# Patient Record
Sex: Female | Born: 1960 | Race: White | Hispanic: No | Smoking: Current every day smoker
Health system: Southern US, Community
[De-identification: ages and names within clinical notes are randomized; demographics above are authoritative.]

## PROBLEM LIST (undated history)

## (undated) DIAGNOSIS — R51 Headache: Secondary | ICD-10-CM

## (undated) DIAGNOSIS — G35 Multiple sclerosis: Secondary | ICD-10-CM

## (undated) DIAGNOSIS — G629 Polyneuropathy, unspecified: Secondary | ICD-10-CM

## (undated) DIAGNOSIS — R519 Headache, unspecified: Secondary | ICD-10-CM

## (undated) DIAGNOSIS — H409 Unspecified glaucoma: Secondary | ICD-10-CM

## (undated) DIAGNOSIS — Z9049 Acquired absence of other specified parts of digestive tract: Secondary | ICD-10-CM

## (undated) HISTORY — DX: Acquired absence of other specified parts of digestive tract: Z90.49

## (undated) HISTORY — DX: Headache, unspecified: R51.9

## (undated) HISTORY — DX: Headache: R51

## (undated) HISTORY — DX: Polyneuropathy, unspecified: G62.9

---
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
## 1999-01-27 ENCOUNTER — Ambulatory Visit (HOSPITAL_COMMUNITY): Admission: RE | Admit: 1999-01-27 | Discharge: 1999-01-27 | Payer: Self-pay | Admitting: Ophthalmology

## 1999-01-27 ENCOUNTER — Encounter: Payer: Self-pay | Admitting: Ophthalmology

## 1999-02-06 ENCOUNTER — Ambulatory Visit: Admission: RE | Admit: 1999-02-06 | Discharge: 1999-02-06 | Payer: Self-pay | Admitting: Anesthesiology

## 1999-11-24 ENCOUNTER — Other Ambulatory Visit: Admission: RE | Admit: 1999-11-24 | Discharge: 1999-11-24 | Payer: Self-pay | Admitting: Internal Medicine

## 2002-01-09 ENCOUNTER — Other Ambulatory Visit: Admission: RE | Admit: 2002-01-09 | Discharge: 2002-01-09 | Payer: Self-pay | Admitting: Internal Medicine

## 2004-04-21 ENCOUNTER — Ambulatory Visit: Payer: Self-pay | Admitting: Internal Medicine

## 2004-04-25 ENCOUNTER — Ambulatory Visit: Payer: Self-pay | Admitting: Internal Medicine

## 2004-05-05 ENCOUNTER — Ambulatory Visit: Payer: Self-pay | Admitting: Internal Medicine

## 2004-05-05 ENCOUNTER — Other Ambulatory Visit: Admission: RE | Admit: 2004-05-05 | Discharge: 2004-05-05 | Payer: Self-pay | Admitting: Internal Medicine

## 2004-07-14 ENCOUNTER — Ambulatory Visit: Payer: Self-pay | Admitting: Internal Medicine

## 2005-01-26 ENCOUNTER — Ambulatory Visit: Payer: Self-pay | Admitting: Internal Medicine

## 2007-11-17 ENCOUNTER — Encounter: Payer: Self-pay | Admitting: Internal Medicine

## 2007-12-05 ENCOUNTER — Encounter: Admission: RE | Admit: 2007-12-05 | Discharge: 2007-12-05 | Payer: Self-pay | Admitting: Gastroenterology

## 2010-07-09 ENCOUNTER — Encounter: Payer: Self-pay | Admitting: Gastroenterology

## 2010-07-10 ENCOUNTER — Encounter: Payer: Self-pay | Admitting: Gastroenterology

## 2013-12-25 ENCOUNTER — Encounter: Payer: Self-pay | Admitting: Neurology

## 2013-12-29 ENCOUNTER — Encounter (INDEPENDENT_AMBULATORY_CARE_PROVIDER_SITE_OTHER): Payer: Self-pay

## 2013-12-29 ENCOUNTER — Ambulatory Visit (INDEPENDENT_AMBULATORY_CARE_PROVIDER_SITE_OTHER): Payer: Medicare Other | Admitting: Neurology

## 2013-12-29 ENCOUNTER — Encounter: Payer: Self-pay | Admitting: Neurology

## 2013-12-29 VITALS — BP 126/77 | HR 70 | Ht 67.5 in | Wt 127.0 lb

## 2013-12-29 DIAGNOSIS — G35 Multiple sclerosis: Secondary | ICD-10-CM

## 2013-12-29 MED ORDER — GABAPENTIN 300 MG PO CAPS: ORAL_CAPSULE | ORAL | Status: DC

## 2013-12-29 MED ORDER — PREDNISONE 10 MG PO TABS: 10.0000 mg | ORAL_TABLET | Freq: Every day | ORAL | Status: DC

## 2013-12-29 NOTE — Patient Instructions (Signed)
Overall you are doing fairly well but I do want to suggest a few things today:   Remember to drink plenty of fluid, eat healthy meals and do not skip any meals. Try to eat protein with a every meal and eat a healthy snack such as fruit or nuts in between meals. Try to keep a regular sleep-wake schedule and try to exercise daily, particularly in the form of walking, 20-30 minutes a day, if you can.   As far as your medications are concerned, I would like to suggest the following: 1)Please decrease prednisone to 10mg  daily for 14 days and then decrease to 5mg  (1/2 tab) daily for 14 days and then stop 2)Please start gabapentin 300mg  three times a day 3)Please take Nexium daily, this can be obtained over the counter from your drug store 4)Please continue to take Avonex  As far as diagnostic testing:  1)Please have a MRI of the brain and cervical spine  I would like to see you back in 4 months, sooner if we need to. Please call us with any interim questions, concerns, problems, updates or refill requests.   My clinical assistant and will answer any of your questions and relay your messages to me and also relay most of my messages to you.   Our phone number is [phone]. We also have an after hours call service for urgent matters and there is a physician on-call for urgent questions. For any emergencies you know to call 911 or go to the nearest emergency room

## 2013-12-29 NOTE — Progress Notes (Signed)
[facility]    Provider:  Dr Hosie Poisson Referring Provider: Richmond Campbell., PA-C Primary Care Physician:  Lilia Argue  CC:  MS follow up   HPI:  Alyssa Gilbert is a 53 y.o. female here as a referral from Dr. Arlyce Dice for MS follow up  Has been on Avonex for MS since diagnosis in 2000. Followed by [facility] in the past, last visit in 2009 (followed by Dr Kelli Hope and his PA). Initial symptom was loss of vision in left eye, slowly returned with steroids. Has had minor flare ups but nothing major until the past year. In the past year has had multiple problems. Currently notes some tingling and pins and needles sensation in her left leg. Is now spreading up, involves her abdomen and rib cage. Notes severe abdominal discomfort and a bloating sensation. Currently taking Avonex but notes she missed her shot this week due to side effects.   In [facility] was going to a free clinic, anytime she felt something coming on the clinic would give her prednisone tablets and she would self medicate. Had stopped prednisone for a year but 3 weeks ago started taking prednisone again. She notes having had 4 MRIs in the past month.   Has history of headaces, notes getting headaches daily. Will take ibuprofen up to 3 times a day for the headache. Typically will take at least one time a day. Notes floaters in her eyes, these are chronic, recently started having auras described as flashing lights. Headaches described as frontal throbbing type pain. She notes having history of glaucoma. Last time she saw her eye doctor was in March.   Review of Systems: Out of a complete 14 system review, the patient complains of only the following symptoms, and all other reviewed systems are negative. *+ easy bruising, feeling hot, fatigue, headache, weakness, dizziness  History   Social History  . Marital Status: Divorced    Spouse Name: N/A    Number of Children: 0  . Years of Education: 12    Occupational History  . Disabled    Social History Main Topics  . Smoking status: Current Every Day Smoker  . Smokeless tobacco: Never Used  . Alcohol Use: No     Comment: quit 2010  . Drug Use: Yes    Special: Marijuana     Comment: Daily   . Sexual Activity: Not on file   Other Topics Concern  . Not on file   Social History Narrative   [name]    [name]    [name]    [name]    Patient has 1 child.    [name]     Family History  Problem Relation Age of Onset  . Lung cancer      Past Medical History  Diagnosis Date  . History of appendectomy     Past Surgical History  Procedure Laterality Date  . Appendectomy  1980    Current Outpatient Prescriptions  Medication Sig Dispense Refill  . interferon beta-1a (AVONEX) 30 MCG injection Inject 30 mcg into the muscle every 7 (seven) days.      Marland Kitchen TIMOLOL MALEATE OP Apply to eye.       No current facility-administered medications for this visit.    Allergies as of 12/29/2013 - Review Complete 12/29/2013  Allergen Reaction Noted  . Penicillins  12/25/2013    Vitals: BP 126/77  Pulse  70  Ht 5' 7.5" (1.715 m)  Wt 127 lb (57.607 kg)  BMI 19.59 kg/m2 Last Weight:  Wt Readings from Last 1 Encounters:  12/29/13 127 lb (57.607 kg)   Last Height:   Ht Readings from Last 1 Encounters:  12/29/13 5' 7.5" (1.715 m)     Physical exam: Exam: Gen: NAD, conversant Eyes: anicteric sclerae, moist conjunctivae HENT: Atraumatic, oropharynx clear Neck: Trachea midline; supple,  Lungs: CTA, no wheezing, rales, rhonic                          CV: RRR, no MRG Abdomen: Soft, non-tender;  Extremities: No peripheral edema  Skin: Normal temperature, no rash,  Psych: Appropriate affect, pleasant  Neuro: MS: AA&Ox3, appropriately interactive, normal affect   Attention: WORLD backwards  Speech: fluent w/o paraphasic  error  Memory: good recent and remote recall  CN: PERRL, EOMI no nystagmus, no ptosis, sensation intact to LT V1-V3 bilat, face symmetric, no weakness, hearing grossly intact, palate elevates symmetrically, shoulder shrug 5/5 bilat,  tongue protrudes midline, no fasiculations noted.  Motor: normal bulk and tone Strength: 5/5  In all extremities with giveaway weakness in left hip flexor  Coord: rapid alternating and point-to-point (FNF, HTS) movements intact.  Reflexes: brisk but symmetrical, bilat downgoing toes  Sens: LT intact in all extremities  Gait: posture, stance, stride and arm-swing normal. Tandem gait intact. Able to walk on heels and toes. Romberg absent.   Assessment:  After physical and neurologic examination, review of laboratory studies, imaging, neurophysiology testing and pre-existing records, assessment will be reviewed on the problem list.  Plan:  Treatment plan and additional workup will be reviewed under Problem List.  1)Multiple sclerosis 2)Chronic daily headache 3)Glaucoma  53y/o with diagnosis of MS since 2000 presenting for initial visit. Has not seen a neurologist since 2009, has been on Avonex since 2000 and reports good overall benefit. In the past and currently has been self treating with oral prednisone 20mg  tablets. These were prescribed by a PCP in [place]. Feels she gets benefit from this and notes headache worsens when she stops. MRI from 2000 consistent with findings of MS. Based on new symptoms (LLE weakness) will repeat imaging at this time. Notes visual floaters and aura, unclear if related to migraine vs glaucoma vs steroid usage. Have concern that GI bloating/pain related to chronic steroid and NSAID usage. Continue Avonex for now. Will taper off oral steroid. Start Nexium. Start Gabapentin 300mg  TID for paresthesias and headache. Counseled patient to follow up with eye doctor. Follow up in 4 months    Elspeth ChoPeter Zarai Orsborn, DO  [facility] [address] [place], [place] [id number]  Phone [phone] Fax [phone]

## 2013-12-31 ENCOUNTER — Ambulatory Visit
Admission: RE | Admit: 2013-12-31 | Discharge: 2013-12-31 | Disposition: A | Discharge status: Home / Self Care | Payer: Medicare Other | Source: Ambulatory Visit | Attending: Neurology | Admitting: Neurology

## 2013-12-31 DIAGNOSIS — G35 Multiple sclerosis: Secondary | ICD-10-CM

## 2013-12-31 MED ORDER — GADOBENATE DIMEGLUMINE 529 MG/ML IV SOLN
11.0000 mL | Freq: Once | INTRAVENOUS | Status: AC | PRN
  Administered 2013-12-31: 11 mL via INTRAVENOUS

## 2014-01-04 ENCOUNTER — Telehealth: Payer: Self-pay | Admitting: *Deleted

## 2014-01-04 NOTE — Telephone Encounter (Signed)
Spoke to patient and she is aware of results. Patient stated that she is still having some tingling but will continue her meds for a couple more weeks. If not better she will call the office back.

## 2014-01-04 NOTE — Telephone Encounter (Signed)
Message copied by Ardeth Sportsman on Mon Jan 04, 2014  4:56 PM ------      Message from: Ramond Marrow      Created: Mon Jan 04, 2014  3:36 PM       Please let her know her MRI shows no active MS lesions. She should continue on her Avonex and follow up as instructed. ------

## 2014-01-27 ENCOUNTER — Telehealth: Payer: Self-pay | Admitting: Neurology

## 2014-01-27 NOTE — Telephone Encounter (Signed)
All info has been sent to ins.  Request is currently under review.  I called the patient back, got no answer.  Left message.

## 2014-01-27 NOTE — Telephone Encounter (Signed)
Patient calling to state that she was informed by [employer] that her Avonex script needs to have prior authorization, she gave us the number to call which is: [phone]. Patient requesting a call when this has been done, please advise.

## 2014-01-28 ENCOUNTER — Telehealth: Payer: Self-pay

## 2014-01-28 NOTE — Telephone Encounter (Signed)
[employer] notified us they have approved our request for coverage on Avonex effective until 01/28/2015 Ref # [id number]

## 2014-02-22 ENCOUNTER — Encounter (HOSPITAL_COMMUNITY): Payer: Self-pay | Admitting: Emergency Medicine

## 2014-02-22 ENCOUNTER — Emergency Department (HOSPITAL_COMMUNITY)
Admission: EM | Admit: 2014-02-22 | Discharge: 2014-02-22 | Discharge status: Against Medical Advice | Payer: Medicare Other | Attending: Emergency Medicine | Admitting: Emergency Medicine

## 2014-02-22 DIAGNOSIS — F17200 Nicotine dependence, unspecified, uncomplicated: Secondary | ICD-10-CM | POA: Diagnosis not present

## 2014-02-22 DIAGNOSIS — G43909 Migraine, unspecified, not intractable, without status migrainosus: Secondary | ICD-10-CM | POA: Diagnosis present

## 2014-02-22 HISTORY — DX: Unspecified glaucoma: H40.9

## 2014-02-22 HISTORY — DX: Multiple sclerosis: G35

## 2014-02-22 NOTE — ED Notes (Signed)
Pt told registration she was leaving, some kind of family problem.

## 2014-02-22 NOTE — ED Notes (Signed)
Pt c/o migraine and emesis x 3-4 days.  Pain score 3/10.  Denies blurred vision.  Reports she has not taken anything for pain.  Hx of MS.

## 2014-03-29 ENCOUNTER — Telehealth: Payer: Self-pay | Admitting: *Deleted

## 2014-03-29 NOTE — Telephone Encounter (Signed)
Call patient about her CDs left message to call the office.

## 2014-05-03 ENCOUNTER — Ambulatory Visit: Payer: Medicare Other | Admitting: Neurology

## 2014-06-25 ENCOUNTER — Institutional Professional Consult (permissible substitution): Payer: Medicare Other | Admitting: Neurology

## 2014-06-25 ENCOUNTER — Ambulatory Visit: Payer: Medicare Other | Admitting: Neurology

## 2014-07-06 ENCOUNTER — Ambulatory Visit: Payer: Medicare Other | Admitting: Neurology

## 2014-07-22 ENCOUNTER — Ambulatory Visit (INDEPENDENT_AMBULATORY_CARE_PROVIDER_SITE_OTHER): Payer: PPO | Admitting: Neurology

## 2014-07-22 ENCOUNTER — Encounter: Payer: Self-pay | Admitting: *Deleted

## 2014-07-22 ENCOUNTER — Encounter: Payer: Self-pay | Admitting: Neurology

## 2014-07-22 VITALS — BP 146/80 | HR 76 | Resp 14 | Ht 67.0 in | Wt 113.6 lb

## 2014-07-22 DIAGNOSIS — G35 Multiple sclerosis: Secondary | ICD-10-CM | POA: Insufficient documentation

## 2014-07-22 DIAGNOSIS — G4489 Other headache syndrome: Secondary | ICD-10-CM | POA: Diagnosis not present

## 2014-07-22 DIAGNOSIS — F329 Major depressive disorder, single episode, unspecified: Secondary | ICD-10-CM | POA: Diagnosis not present

## 2014-07-22 DIAGNOSIS — H469 Unspecified optic neuritis: Secondary | ICD-10-CM | POA: Insufficient documentation

## 2014-07-22 DIAGNOSIS — F32A Depression, unspecified: Secondary | ICD-10-CM | POA: Insufficient documentation

## 2014-07-22 DIAGNOSIS — R5382 Chronic fatigue, unspecified: Secondary | ICD-10-CM | POA: Diagnosis not present

## 2014-07-22 MED ORDER — INTERFERON BETA-1A 30 MCG IM KIT: 30.0000 ug | PACK | INTRAMUSCULAR | Status: DC

## 2014-07-22 MED ORDER — DULOXETINE HCL 60 MG PO CPEP: 60.0000 mg | ORAL_CAPSULE | Freq: Every day | ORAL | Status: DC

## 2014-07-22 NOTE — Progress Notes (Signed)
[facility]  PATIENT: Alyssa Gilbert DOB: 01/30/1961  REFERRING CLINICIAN: Mady Gemma HISTORY FROM: Patient REASON FOR VISIT:  MS   HISTORICAL  CHIEF COMPLAINT:  Chief Complaint  Patient presents with  . Multiple Sclerosis    Dx. with MS in Aug. 2000.  Presenting sx. optic neuritis left eye.  Initially eval by Dr. Kathlene November [place]--dx. confirmed with mri and lp.  She has been on Avonex since dx.  Sts. she lost her job/ins. 10 yrs. ago and has not seen a neurologist since then.  Sts. pcp has been giving Avonex rx.  Last mri done in [facility] last yr--she thinks cd's are here.  Sts. h/a's, vision worse with stress of caring for elderly mother./fim  . Depression    Sts. has been feeling more depressed over the last several days.  Sts. Prozac has not helped in the past.  . Dizziness    She c/o occasional dizziness with standing/fim    HISTORY OF PRESENT ILLNESS:  Alyssa Gilbert is a 54 year old woman who was diagnosed with MS in 2000 after presenting with left optic neuritis. Over a period of a week or so she had complete blindness in that eye. Distant with MS. A lumbar puncture showed that the CSF was also consistent with MS. She first received some steroids and then was placed on Avonex.   Her MS has generally done well on Avonex without major exacerbation. However, she will get some alterations in her symptoms stress, especially, seems to bring on more neurologic symptoms. I personally reviewed her MRI from July 2015. It shows white matter foci, predominantly in the periventricular region that are consistent with the clinical diagnosis of multiple sclerosis. There were no enhancing foci.  She notes that her gait is sometimes off balance. She does not have any significant weakness in the legs. She occasionally will have some tingling in the legs. Gabapentin helped the tingling in her pain and when she stopped the gabapentin the tingling stayed away.  She reports that bladder  function is fine.  She has had some residual left visual problems since the optic neuritis years ago. Additionally, she has glaucoma and cataracts. These are located in both eyes. She regularly sees an ophthalmologist. She feels her vision has slowly progressively worsened over the past few years.  She notes fatigue on a daily basis. This worsens as the day goes on, most days. Her fatigue is physical more than cognitive. She also notes depression that has been worse lately. He has had some crying spells. She feels very apathetic much of the day. She also has some anxiety as she takes alprazolam 0.5 mg half a tablet twice a day with some benefit.   She notes some insomnia. This is worse if she does not take a Xanax at night.  She has mild cognitive dysfunction. This is mostly with short-term memory. She finds that if she writes things down she would do much better.  She reports a lot of headaches. The pain is bilateral and above the eyes.  She notes that bright lights bother her. Her headaches are more severe she might have mild nausea but does not have vomiting. Not much difference in the pain based on movement. These occur on a daily basis. She finds that they worsen as the day goes on. Alprazolam slightly helps them.  She reports that she smokes marijuana about once or twice a day and that that helps the headaches.  She would like to exercise more but feels  she is unable to because of the headaches and fatigue.     REVIEW OF SYSTEMS:  Constitutional: No fevers, chills, sweats, or change in appetite.   She notes fatigue.    Eyes: No visual changes, double vision, eye pain Ear, nose and throat: No hearing loss, ear pain, nasal congestion, sore throat Cardiovascular: No chest pain, palpitations Respiratory:  No shortness of breath at rest or with exertion.   No wheezes GastrointestinaI: No nausea, vomiting, diarrhea, abdominal pain, fecal incontinence Genitourinary:  No dysuria, urinary retention  or frequency.  No nocturia. Musculoskeletal:  No neck pain, back pain Integumentary: No rash, pruritus, skin lesions Neurological: as above Psychiatric: She reports depression and anxiety.  She has apathy Endocrine: No palpitations, diaphoresis, change in appetite, change in weigh or increased thirst Hematologic/Lymphatic:  No anemia, purpura, petechiae. Allergic/Immunologic: No itchy/runny eyes, nasal congestion, recent allergic reactions, rashes  ALLERGIES: Allergies  Allergen Reactions  . Penicillins     HOME MEDICATIONS: Outpatient Prescriptions Prior to Visit  Medication Sig Dispense Refill  . interferon beta-1a (AVONEX) 30 MCG injection Inject 30 mcg into the muscle every 7 (seven) days.    Marland Kitchen TIMOLOL MALEATE OP Apply to eye.    . gabapentin (NEURONTIN) 300 MG capsule 1 capsule qhs for 3 days then increase to bid for 3 days then increase to 1 capsule three times a day (Patient not taking: Reported on 07/22/2014) 90 capsule 3  . predniSONE (DELTASONE) 10 MG tablet Take 1 tablet (10 mg total) by mouth daily. Take 1 tab daily for 14 days, then decrease to 1/2 tab daily for 14 days and then stop (Patient not taking: Reported on 07/22/2014) 21 tablet 0   No facility-administered medications prior to visit.    PAST MEDICAL HISTORY: Past Medical History  Diagnosis Date  . History of appendectomy   . MS (multiple sclerosis)   . Glaucoma   . Headache   . Neuropathy     PAST SURGICAL HISTORY: Past Surgical History  Procedure Laterality Date  . Appendectomy  1980    FAMILY HISTORY: Family History  Problem Relation Age of Onset  . Lung cancer    . Breast cancer Mother   . Hypertension Mother   . Lung cancer Father     SOCIAL HISTORY:  History   Social History  . Marital Status: Divorced    Spouse Name: N/A    Number of Children: 0  . Years of Education: 12   Occupational History  . Disabled    Social History Main Topics  . Smoking status: Current Every Day Smoker  -- 1.00 packs/day    Types: Cigarettes  . Smokeless tobacco: Never Used  . Alcohol Use: No     Comment: quit 2010  . Drug Use: Yes    Special: Marijuana     Comment: 2-3 times per day  . Sexual Activity: Not on file   Other Topics Concern  . Not on file   Social History Narrative   [name]    [name]    [name]    [name]    Patient has 1 child.    [name]      PHYSICAL EXAM  Filed Vitals:   07/22/14 1408  BP: 146/80  Pulse: 76  Resp: 14  Height:  (1.702 m)  Weight: 113 lb 9.6 oz (51.529 kg)    Body mass index is 17.79  kg/(m^2).   General: The patient is a thin well-developed woman in no acute distress  Eyes:  Funduscopic exam was difficult due to astigmatism and myopia.  Neck: The neck is supple, no carotid bruits are noted.  The neck is nontender.  Respiratory: The respiratory examination is clear.  Cardiovascular: The cardiovascular examination reveals a regular rate and rhythm, no murmurs, gallops or rubs are noted.  Skin: Extremities are without significant edema.  Neurologic Exam  Mental status: The patient is alert and oriented x 3 at the time of the examination. The patient has apparent normal recent and remote memory but reduced attention span with easy distactability.   Speech is normal.  Cranial nerves: Extraocular movements are full. Pupils show 2 + left APD and she has reduced color vision out of left eye.   More symmetric acuity.   Visual fields are full.  Facial symmetry is present. There is good facial sensation to soft touch bilaterally.Facial strength is normal.  Trapezius and sternocleidomastoid strength is normal. No dysarthria is noted.  The tongue is midline, and the patient has symmetric elevation of the soft palate. No obvious hearing deficits are noted.  Motor:  Muscle bulk and tone are normal. Strength is  5 / 5 in all 4 extremities.     Sensory: Sensory testing is intact to pinprick, soft touch, vibration sensation, and position sense on all 4 extremities.  Coordination: Cerebellar testing reveals good finger-nose-finger and heel-to-shin bilaterally.  Gait and station: Station and gait are normal. Tandem gait is mildly wide. Romberg is negative.   Reflexes: Deep tendon reflexes are symmetric and increased in legs (spread at knees). Plantar responses are normal.    DIAGNOSTIC DATA (LABS, IMAGING, TESTING) - I reviewed patient records, labs, notes, testing and imaging myself where available.    ASSESSMENT AND PLAN Multiple sclerosis  Depression  Chronic fatigue  Other headache syndrome  Optic neuritis   In summary, Dariella Gillihan is a 54 year old woman with a long history of multiple sclerosis who has done rather well on Avonex therapy for the past 15 years. I personally reviewed the recent MRIs of the brain and cervical spine. She has no acute foci but has many chronic periventricular and subcortical and deep white matter foci.  She does not have any lesions in her spine. She complains of fatigue, headache and depression. I will place her on Cymbalta with the hope that it helps the depression and possibly helps the headaches if some of the apathy is caused by depression she might improve with that also. We will send it a refill for her interferon. She gets patient assistance and we will fill out for the paperwork as needed.  She will return to see me in 5 months, sooner if she has new or worsening neurologic symptoms.    Richard A. Epimenio Foot, MD, PhD 07/22/2014, 2:33 PM Certified in Neurology, Clinical Neurophysiology, Sleep Medicine, Pain Medicine and Neuroimaging  [facility] [address], [address] [place], [place] [address] [phone]

## 2014-07-22 NOTE — Patient Instructions (Signed)

## 2015-01-10 ENCOUNTER — Emergency Department (HOSPITAL_COMMUNITY)
Admission: EM | Admit: 2015-01-10 | Discharge: 2015-01-10 | Disposition: A | Discharge status: Home / Self Care | Payer: PPO | Attending: Emergency Medicine | Admitting: Emergency Medicine

## 2015-01-10 ENCOUNTER — Emergency Department (HOSPITAL_COMMUNITY): Payer: PPO

## 2015-01-10 ENCOUNTER — Encounter (HOSPITAL_COMMUNITY): Payer: Self-pay | Admitting: Family Medicine

## 2015-01-10 DIAGNOSIS — S29001A Unspecified injury of muscle and tendon of front wall of thorax, initial encounter: Secondary | ICD-10-CM | POA: Insufficient documentation

## 2015-01-10 DIAGNOSIS — G629 Polyneuropathy, unspecified: Secondary | ICD-10-CM | POA: Diagnosis not present

## 2015-01-10 DIAGNOSIS — R079 Chest pain, unspecified: Secondary | ICD-10-CM

## 2015-01-10 DIAGNOSIS — Y9389 Activity, other specified: Secondary | ICD-10-CM | POA: Diagnosis not present

## 2015-01-10 DIAGNOSIS — Z880 Allergy status to penicillin: Secondary | ICD-10-CM | POA: Diagnosis not present

## 2015-01-10 DIAGNOSIS — I3139 Other pericardial effusion (noninflammatory): Secondary | ICD-10-CM

## 2015-01-10 DIAGNOSIS — W1839XA Other fall on same level, initial encounter: Secondary | ICD-10-CM | POA: Insufficient documentation

## 2015-01-10 DIAGNOSIS — Z79899 Other long term (current) drug therapy: Secondary | ICD-10-CM | POA: Diagnosis not present

## 2015-01-10 DIAGNOSIS — Y92000 Kitchen of unspecified non-institutional (private) residence as  the place of occurrence of the external cause: Secondary | ICD-10-CM | POA: Diagnosis not present

## 2015-01-10 DIAGNOSIS — Z720 Tobacco use: Secondary | ICD-10-CM | POA: Diagnosis not present

## 2015-01-10 DIAGNOSIS — Y998 Other external cause status: Secondary | ICD-10-CM | POA: Insufficient documentation

## 2015-01-10 DIAGNOSIS — S299XXA Unspecified injury of thorax, initial encounter: Secondary | ICD-10-CM

## 2015-01-10 DIAGNOSIS — I313 Pericardial effusion (noninflammatory): Secondary | ICD-10-CM | POA: Diagnosis not present

## 2015-01-10 LAB — I-STAT TROPONIN, ED: TROPONIN I, POC: 0.02 ng/mL (ref 0.00–0.08)

## 2015-01-10 LAB — BASIC METABOLIC PANEL
Anion gap: 6 (ref 5–15)
BUN: 7 mg/dL (ref 6–20)
CO2: 24 mmol/L (ref 22–32)
CREATININE: 0.65 mg/dL (ref 0.44–1.00)
Calcium: 9.2 mg/dL (ref 8.9–10.3)
Chloride: 109 mmol/L (ref 101–111)
GFR calc Af Amer: >60 mL/min (ref >60)
GFR calc non Af Amer: >60 mL/min (ref >60)
GLUCOSE: 110 mg/dL — AB (ref 65–99)
POTASSIUM: 3.7 mmol/L (ref 3.5–5.1)
SODIUM: 139 mmol/L (ref 135–145)

## 2015-01-10 LAB — CBC
HCT: 41.3 % (ref 36.0–46.0)
Hemoglobin: 14.1 g/dL (ref 12.0–15.0)
MCH: 30.9 pg (ref 26.0–34.0)
MCHC: 34.1 g/dL (ref 30.0–36.0)
MCV: 90.4 fL (ref 78.0–100.0)
Platelets: 255 10*3/uL (ref 150–400)
RBC: 4.57 MIL/uL (ref 3.87–5.11)
RDW: 12.9 % (ref 11.5–15.5)
WBC: 8.6 10*3/uL (ref 4.0–10.5)

## 2015-01-10 MED ORDER — NAPROXEN 500 MG PO TABS: 500.0000 mg | ORAL_TABLET | Freq: Two times a day (BID) | ORAL | Status: DC

## 2015-01-10 MED ORDER — KETOROLAC TROMETHAMINE 30 MG/ML IJ SOLN
30.0000 mg | Freq: Once | INTRAMUSCULAR | Status: AC
  Administered 2015-01-10: 30 mg via INTRAVENOUS
  Filled 2015-01-10: qty 1

## 2015-01-10 MED ORDER — OXYCODONE-ACETAMINOPHEN 5-325 MG PO TABS: 2.0000 | ORAL_TABLET | Freq: Once | ORAL | Status: DC

## 2015-01-10 NOTE — Discharge Instructions (Signed)
Blunt Chest Trauma °Blunt chest trauma is an injury caused by a blow to the chest. These chest injuries can be very painful. Blunt chest trauma often results in bruised or broken (fractured) ribs. Most cases of bruised and fractured ribs from blunt chest traumas get better after 1 to 3 weeks of rest and pain medicine. Often, the soft tissue in the chest wall is also injured, causing pain and bruising. Internal organs, such as the heart and lungs, may also be injured. Blunt chest trauma can lead to serious medical problems. This injury requires immediate medical care. °CAUSES  °· Motor vehicle collisions. °· Falls. °· Physical violence. °· Sports injuries. °SYMPTOMS  °· Chest pain. The pain may be worse when you move or breathe deeply. °· Shortness of breath. °· Lightheadedness. °· Bruising. °· Tenderness. °· Swelling. °DIAGNOSIS  °Your caregiver will do a physical exam. X-rays may be taken to look for fractures. However, minor rib fractures may not show up on X-rays until a few days after the injury. If a more serious injury is suspected, further imaging tests may be done. This may include ultrasounds, computed tomography (CT) scans, or magnetic resonance imaging (MRI). °TREATMENT  °Treatment depends on the severity of your injury. Your caregiver may prescribe pain medicines and deep breathing exercises. °HOME CARE INSTRUCTIONS °· Limit your activities until you can move around without much pain. °· Do not do any strenuous work until your injury is healed. °· Put ice on the injured area. °¨ Put ice in a plastic bag. °¨ Place a towel between your skin and the bag. °¨ Leave the ice on for 15-20 minutes, 03-04 times a day. °· You may wear a rib belt as directed by your caregiver to reduce pain. °· Practice deep breathing as directed by your caregiver to keep your lungs clear. °· Only take over-the-counter or prescription medicines for pain, fever, or discomfort as directed by your caregiver. °SEEK IMMEDIATE MEDICAL  CARE IF:  °· You have increasing pain or shortness of breath. °· You cough up blood. °· You have nausea, vomiting, or abdominal pain. °· You have a fever. °· You feel dizzy, weak, or you faint. °MAKE SURE YOU: °· Understand these instructions. °· Will watch your condition. °· Will get help right away if you are not doing well or get worse. °Document Released: 07/12/2004 Document Revised: 08/27/2011 Document Reviewed: 03/21/2011 °[employer]® Patient Information ©2015 [employer]. This information is not intended to replace advice given to you by your health care provider. Make sure you discuss any questions you have with your health care provider. ° ° °

## 2015-01-10 NOTE — ED Notes (Signed)
Patient stating she has not eaten all day.  Offered sandwich, applesauce, crackers and peanut butter but patient refused.  Patient stated she is ready to go home.  MD aware

## 2015-01-10 NOTE — ED Provider Notes (Signed)
CSN: [id number]     Arrival date & time 01/10/15  1510 History   First MD Initiated Contact with Patient 01/10/15 1714     Chief Complaint  Patient presents with  . Chest Pain     (Consider location/radiation/quality/duration/timing/severity/associated sxs/prior Treatment) HPI Comments: Pt states two days ago she was standing up which was described as shaking or UE by roommate. She fell into the edge of a countertop, but was kept upright by roommate.   Patient is a 54 y.o. female presenting with chest pain and seizures.  Chest Pain Pain location:  Substernal area Pain quality: aching   Pain radiates to:  Does not radiate Pain radiates to the back: no   Pain severity:  Moderate Onset quality:  Sudden Duration:  2 days Timing:  Constant Progression:  Unchanged Chronicity:  New Context: trauma (fell into countertop edge while having what her roomaate described as a seizure.)   Relieved by:  Nothing Worsened by:  Coughing and movement Ineffective treatments:  None tried Associated symptoms: no abdominal pain, no back pain, no cough, no diaphoresis, no fatigue, no fever, no headache, no nausea, no numbness, no palpitations, no shortness of breath, not vomiting and no weakness   Risk factors: no coronary artery disease   Seizures Seizure activity on arrival: no   Seizure type:  Unable to specify Initial focality:  None Episode characteristics: abnormal movements   Postictal symptoms: memory loss   Return to baseline: yes   Timing:  Once Number of seizures this episode:  1 Recent head injury:  No recent head injuries PTA treatment:  None History of seizures: yes     Past Medical History  Diagnosis Date  . History of appendectomy   . MS (multiple sclerosis)   . Glaucoma   . Headache   . Neuropathy    Past Surgical History  Procedure Laterality Date  . Appendectomy  1980   Family History  Problem Relation Age of Onset  . Lung cancer    . Breast cancer Mother   .  Hypertension Mother   . Lung cancer Father    History  Substance Use Topics  . Smoking status: Current Every Day Smoker -- 1.00 packs/day    Types: Cigarettes  . Smokeless tobacco: Never Used  . Alcohol Use: No     Comment: quit 2010   OB History    No data available     Review of Systems  Constitutional: Negative for fever, chills, diaphoresis, activity change, appetite change and fatigue.  HENT: Negative for congestion, facial swelling, rhinorrhea and sore throat.   Eyes: Negative for photophobia and discharge.  Respiratory: Negative for cough, chest tightness and shortness of breath.   Cardiovascular: Positive for chest pain. Negative for palpitations and leg swelling.  Gastrointestinal: Negative for nausea, vomiting, abdominal pain and diarrhea.  Endocrine: Negative for polydipsia and polyuria.  Genitourinary: Negative for dysuria, frequency, difficulty urinating and pelvic pain.  Musculoskeletal: Negative for back pain, arthralgias, neck pain and neck stiffness.  Skin: Negative for color change and wound.  Allergic/Immunologic: Negative for immunocompromised state.  Neurological: Positive for seizures. Negative for facial asymmetry, weakness, numbness and headaches.  Hematological: Does not bruise/bleed easily.  Psychiatric/Behavioral: Negative for confusion and agitation.      Allergies  Penicillins  Home Medications   Prior to Admission medications   Medication Sig Start Date End Date Taking? Authorizing Provider  ALPRAZolam Prudy Feeler) 0.5 MG tablet Take 0.5 mg by mouth at bedtime as needed for  sleep (migraines). as directed 06/28/14  Yes Historical Provider, MD  escitalopram (LEXAPRO) 10 MG tablet Take 10 mg by mouth daily. 12/21/14  Yes Historical Provider, MD  ibuprofen (ADVIL,MOTRIN) 200 MG tablet Take 200 mg by mouth every 6 (six) hours as needed for mild pain or moderate pain.   Yes Historical Provider, MD  interferon beta-1a (AVONEX) 30 MCG injection Inject 30 mcg  into the muscle every 7 (seven) days. 07/22/14  Yes Asa Lente, MD  PROAIR HFA 108 (90 BASE) MCG/ACT inhaler Inhale 1-2 puffs into the lungs every 4 (four) hours as needed for wheezing or shortness of breath.  12/17/14  Yes Historical Provider, MD  timolol (TIMOPTIC) 0.5 % ophthalmic solution Place 1 drop into both eyes 2 (two) times daily. 12/21/14  Yes Historical Provider, MD  DULoxetine (CYMBALTA) 60 MG capsule Take 1 capsule (60 mg total) by mouth daily. Patient not taking: Reported on 01/10/2015 07/22/14   Asa Lente, MD  gabapentin (NEURONTIN) 300 MG capsule 1 capsule qhs for 3 days then increase to bid for 3 days then increase to 1 capsule three times a day Patient not taking: Reported on 07/22/2014 12/29/13   Ramond Marrow, DO  predniSONE (DELTASONE) 10 MG tablet Take 1 tablet (10 mg total) by mouth daily. Take 1 tab daily for 14 days, then decrease to 1/2 tab daily for 14 days and then stop Patient not taking: Reported on 07/22/2014 12/29/13   Ramond Marrow, DO   BP 126/59 mmHg  Pulse 67  Temp(Src) 97.9 F (36.6 C) (Oral)  Resp 18  SpO2 100% Physical Exam  Constitutional: She is oriented to person, place, and time. She appears well-developed and well-nourished. No distress.  HENT:  Head: Normocephalic and atraumatic.  Mouth/Throat: No oropharyngeal exudate.  Eyes: Pupils are equal, round, and reactive to light.  Neck: Normal range of motion. Neck supple.  Cardiovascular: Normal rate, regular rhythm and normal heart sounds.  Exam reveals no gallop and no friction rub.   No murmur heard. Pulmonary/Chest: Effort normal and breath sounds normal. No respiratory distress. She has no wheezes. She has no rales.    Abdominal: Soft. Bowel sounds are normal. She exhibits no distension and no mass. There is no tenderness. There is no rebound and no guarding.  Musculoskeletal: Normal range of motion. She exhibits no edema or tenderness.  Neurological: She is alert and oriented to person,  place, and time.  Skin: Skin is warm and dry.  Psychiatric: She has a normal mood and affect.    ED Course  Procedures (including critical care time) Labs Review Labs Reviewed  BASIC METABOLIC PANEL - Abnormal; Notable for the following:    Glucose, Bld 110 (*)    All other components within normal limits  CBC  I-STAT TROPOININ, ED    Imaging Review Dg Chest 2 View  01/10/2015   CLINICAL DATA:  Chest pain.  Seizure.  Fall.  EXAM: CHEST  2 VIEW  COMPARISON:  12/05/2007  FINDINGS: Lungs are hyperaerated. Bibasilar nipple shadows are noted. Normal heart size. No pneumothorax or pleural effusion. No evidence of consolidation or mass. There is a new indentation in the anterior cortex of the upper sternum. Fracture is not excluded.  IMPRESSION: Possible buckle fracture of the sternum.   Electronically Signed   By: Jolaine Click M.D.   On: 01/10/2015 16:03     EKG Interpretation   Date/Time:  Monday January 10 2015 15:26:24 EDT Ventricular Rate:  82 PR Interval:  116 QRS Duration: 70 QT Interval:  358 QTC Calculation: 418 R Axis:   110 Text Interpretation:  Normal sinus rhythm Possible Right ventricular  hypertrophy Abnormal ECG Confirmed by Blimi Godby  MD, Ryley Bachtel ([id number]) on  01/10/2015 5:23:54 PM      MDM   Final diagnoses:  Chest pain    Pt is a 54 y.o. female with Pmhx as above who presents with chest wall pain since falling into an edge of a countertop 3 days ago while having what her roommate described as a seizure.  She has had 2 other similar episodes in the past.  She states her roommate reported that she had generalize shaking of her upper extremities and fell into the edge of the countertop.  However, the roommate was able to assist her and standing up during the episode and she did not fall.  Seizure was preceded by dizziness described as lightheadedness which was similar to prior episode.  She reports seeing her neurologist for this, who was concerned that maybe it was a  seizure and did an MRI which had no acute findings.  She states she was supposed to follow up for an EEG, but did not have the money.  She's not had fevers, chills, cough or shortness of breath, though she does have increased pain with movement and with coughing.  She has upper sternal pain on physical exam without bruising.  Lungs are clear, she's no focal neuro findings.  She is an indentation in her upper sternum on chest x-ray, cannot rule out acute fracture.  Given that this is a high impact injury and there is potential for other occult thoracic injuries, will get CT chest.   CT chest shows no fx, tiny pericardial effusion which is likely traumatic. VS are stable. I believe she is safe to d/c home and rec she call her neurologist's office tomorrow.    Vernice Jefferson evaluation in the Emergency Department is complete. It has been determined that no acute conditions requiring further emergency intervention are present at this time. The patient/guardian have been advised of the diagnosis and plan. We have discussed signs and symptoms that warrant return to the ED, such as changes or worsening in symptoms, worsening pain, SOB, fever.     Toy Cookey, MD 01/11/15 [phone]

## 2015-01-10 NOTE — ED Notes (Addendum)
Pt here for chest pain due to fall and injury to chest. sts she fell onto kitchen counter on chest above breast bone. sts she was dizzy and that is what caused her fall. Pt is calling this episode a seizure and she remember the incident. sts she has been having these "seizures" and is schedule for EEG.

## 2015-01-10 NOTE — ED Notes (Signed)
Discharge instructions reviewed with patient, voiced understanding.  Patient wanted to leave because she is unable to drive at night.

## 2015-03-07 ENCOUNTER — Telehealth: Payer: Self-pay | Admitting: Neurology

## 2015-03-07 NOTE — Telephone Encounter (Signed)
Alyssa Gilbert with [facility] calling requesting RX for Tecfidera. She can be reached at [phone]

## 2015-03-08 ENCOUNTER — Encounter: Payer: PPO | Attending: Family Medicine | Admitting: Dietician

## 2015-03-08 VITALS — Ht 67.5 in | Wt 107.0 lb

## 2015-03-08 DIAGNOSIS — Z713 Dietary counseling and surveillance: Secondary | ICD-10-CM | POA: Diagnosis not present

## 2015-03-08 DIAGNOSIS — R634 Abnormal weight loss: Secondary | ICD-10-CM | POA: Insufficient documentation

## 2015-03-08 NOTE — Telephone Encounter (Signed)
I have spoken with a pharmacist at [facility].  He sts. they received a rx. for Tecfidera today from Dr. Craige Cotta at [facility].  I advised we will leave rx. in Dr. Evlyn Courier name, and pt. will need to be seen by Dr. Epimenio Foot at [facility] before further rx's can come from this office/fim

## 2015-03-08 NOTE — Patient Instructions (Addendum)
Aim to eat something every 3-5 hours your are awake. Try to have something to eat within an hour of waking up. Try Boost Plus (high calorie) 1-2 x day. Limit soda and smoking to reduce air in stomach.  Have one smoothie a day with fruit and maybe spinach. Try having cheese or peanut butter with fruit/cracker for snacks. Continue having full fat yogurt or kefir.  Add some vegetables to meats for small meals.

## 2015-03-08 NOTE — Progress Notes (Signed)
  Medical Nutrition Therapy:  Appt start time: 0800 end time:  0900.   Assessment:  Primary concerns today: Maize is here today since she is losing weight over the last few months. Has MS and is currently having a flare. Having numbness in face and tongue. Currently in the process of switching medications. Was taking prednisone for 10 day and had IV steroids which was upsetting her stomach. Was having bloating and thrush. Was constipated on the prednisone. Was not eating. Normal body weight is 110-120 lbs. Started doing smoothies and eating salmon. Feeling badly all the time and has a lot of migraines and feeling weak. Trying to eat several small meals per day. Gets full and bloated easily.   Would like to get weight back to 120-125 lbs. Living with a roommate and does her own meal preparation. Tends to miss breakfast since she sleeps later. Has been eating more lately. Lowest weight was 103 lbs (107 lbs today). Has been increasing amount of food for past 3 weeks or so. Having about 1 Boost per day for the past 2 weeks.   Trying to work on quitting smoking and having less soda.  Gets up around 9-10 AM.   Preferred Learning Style:   No preference indicated   Learning Readiness:   Ready  MEDICATIONS: see list   DIETARY INTAKE:  Usual eating pattern includes 2-3 meals and 1 snacks per day.  Avoided foods include doesn't eat a lot of red meat, liver, onion, beets, nuts (can't chew well)   24-hr recall:  B ( AM): none  Snk ( AM): none  L (11 AM): jimmy dean croissant sandwich or smoothie with banana, chocolate protein drink, and frozen berries Snk (2 PM): salmon or chicken salad (small amounts) with crackers or cheeseburger from [employer] or sweet potato and broccoli casserole D (7 PM): meatloaf, salmon, chicken Snk ( PM): chocolate candies or pringles  Beverages: water, 16 oz or more of soda, drinking 1 Boost or more per day   Usual physical activity: none, can only walk short  distances though joined the [employer]  Estimated energy needs: 1800 calories 200 g carbohydrates 135 g protein 50 g fat  Progress Towards Goal(s):  In progress.   Nutritional Diagnosis:  Gordon-3.2 Unintentional weight loss As related to chronic diet and medications.  As evidenced by BMI of 16.5 and recent weight loss.    Intervention:  Nutrition counseling provided. Plan: Aim to eat something every 3-5 hours your are awake. Try to have something to eat within an hour of waking up. Try Boost Plus (high calorie) 1-2 x day. Limit soda and smoking to reduce air in stomach.  Have one smoothie a day with fruit and maybe spinach. Try having cheese or peanut butter with fruit/cracker for snacks. Continue having full fat yogurt or kefir.  Add some vegetables to meats for small meals.  Teaching Method Utilized:  Visual Auditory Hands on  Handouts given during visit include:  High Protein/High Calorie Meals from eatright.org  5 day 1800 calorie meal plan  Barriers to learning/adherence to lifestyle change: low energy, gets full easily, bloating  Demonstrated degree of understanding via:  Teach Back   Monitoring/Evaluation:  Dietary intake, exercise, and body weight prn.

## 2015-03-29 ENCOUNTER — Other Ambulatory Visit: Payer: Self-pay

## 2015-03-29 DIAGNOSIS — Z1231 Encounter for screening mammogram for malignant neoplasm of breast: Secondary | ICD-10-CM

## 2015-03-31 ENCOUNTER — Ambulatory Visit: Payer: PPO

## 2015-10-13 ENCOUNTER — Other Ambulatory Visit: Payer: Self-pay

## 2015-10-13 NOTE — Patient Outreach (Addendum)
[facility]) Care Management  10/13/2015  Alyssa Gilbert 10-27-1960 [id number]  Telephonic Screening   Referral Date:  10/10/2015 Source:  HTA High Risk List Issue:  Admissions/ER visits: none.  No diagnosis listed.   Outreach call to patient.  Patient reached.  Screening completed. [facility] introduction to services provided.     Providers: Primary MD: Dr. Mady Gemma  next appt:  09/2015 Neurologist:  Dr. Curt Bears, [facility]   HH: None  [employer] Representative will contact patient intermittently to follow-up on issues with MD drug.  Insurance:  HTA and Medicaid   Social: Patient lives in her home with two room mates.  Mobility: Ambulates with no assistive devices.  Falls: none  Pain: none  Depression: well managed with medication regime. Transportation: yes  Caregiver: two room mates  DME: eyeglasses  H/o MS (2000), optic neuritis, depression, migraines States last MRI showed no progression of MS.  States planning to start her 3rd medication for MS.  Denies any mobility issues or falls over the past year.  States her MS is stable.  States she has scheduled follow-up appt with Dr. Arlyce Dice for medication refill for migraine HA management.  States she keeps all her appt's and has no transportation issues.   Medications:  Patient taking less than 15 medications  Co-pay cost issues: None (Medicaid) Flu Vaccine: unable to recall date Pneumonia Vaccine: unable to recall date  Objective:   Encounter Medications:  Outpatient Encounter Prescriptions as of 10/13/2015  Medication Sig Note  . ALPRAZolam (XANAX) 0.5 MG tablet Take 0.5 mg by mouth at bedtime as needed for sleep (migraines). as directed 01/10/2015: ...  . Docusate Calcium (STOOL SOFTENER PO) Take by mouth.   . DULoxetine (CYMBALTA) 60 MG capsule Take 1 capsule (60 mg total) by mouth daily. (Patient not taking: Reported on 01/10/2015)   . escitalopram (LEXAPRO) 10 MG tablet Take 10 mg by  mouth daily. 01/10/2015: ...  . gabapentin (NEURONTIN) 300 MG capsule 1 capsule qhs for 3 days then increase to bid for 3 days then increase to 1 capsule three times a day (Patient not taking: Reported on 07/22/2014)   . ibuprofen (ADVIL,MOTRIN) 200 MG tablet Take 200 mg by mouth every 6 (six) hours as needed for mild pain or moderate pain.   Marland Kitchen interferon beta-1a (AVONEX) 30 MCG injection Inject 30 mcg into the muscle every 7 (seven) days. 07/22/2014: Pt gets free drug thru [employer] pt assistance.  Rx faxed to [facility] fax # [phone].  Phone # [phone]/fim  . Lactobacillus (PROBIOTIC ACIDOPHILUS PO) Take by mouth.   . naproxen (NAPROSYN) 500 MG tablet Take 1 tablet (500 mg total) by mouth 2 (two) times daily with a meal.   . predniSONE (DELTASONE) 10 MG tablet Take 1 tablet (10 mg total) by mouth daily. Take 1 tab daily for 14 days, then decrease to 1/2 tab daily for 14 days and then stop (Patient not taking: Reported on 07/22/2014)   . PROAIR HFA 108 (90 BASE) MCG/ACT inhaler Inhale 1-2 puffs into the lungs every 4 (four) hours as needed for wheezing or shortness of breath.  01/10/2015: Received from: External Pharmacy Received Sig: INHALE 1-2 PUFFS EVERY 4 TO 6 HOURS AS NEEDED  . timolol (TIMOPTIC) 0.5 % ophthalmic solution Place 1 drop into both eyes 2 (two) times daily. 01/10/2015: ...  . VITAMIN D, CHOLECALCIFEROL, PO Take by mouth.    No facility-administered encounter medications on file as of 10/13/2015.    Functional Status:  In your present state of health, do you have any difficulty performing the following activities: 10/13/2015  Hearing? N  Vision? N  Difficulty concentrating or making decisions? N  Walking or climbing stairs? N  Dressing or bathing? N  Doing errands, shopping? N  Preparing Food and eating ? N  Using the Toilet? N  In the past six months, have you accidently leaked urine? N  Do you have problems with loss of bowel control? N  Managing your Medications? N   Managing your Finances? N  Housekeeping or managing your Housekeeping? N    Fall/Depression Screening: PHQ 2/9 Scores 10/13/2015 03/08/2015  PHQ - 2 Score 0 0   Fall Risk  10/13/2015 03/08/2015  Falls in the past year? No No  Risk for fall due to : Impaired vision -  Risk for fall due to (comments): H/o optic neuritis -   Assessment / Plan:  Screening completed; no needs identified.  -RN CM advised in case closure letter to come with contact information; advised should needs arise (examples provided that patient may contact [facility] and self refer and discuss her needs with Dr. Arlyce Dice who can make referral.  -RN CM notified MD via case closure letter.  -RN CM notified [facility] Care Management Assistant:  Case closed.   RN CM advised to please notify MD of any changes in condition prior to scheduled appt's.   RN CM provided contact name and # [phone] or main office # [phone] and 24-hour nurse line # [phone].  RN CM confirmed patient is aware of 911 services for urgent emergency needs.  Donato Schultz, RN, BSN, [facility], CCM  [facility] [employer] Management Coordinator [phone] Direct [phone] Cell [phone] Office [phone] Fax

## 2015-11-16 ENCOUNTER — Ambulatory Visit: Payer: PPO

## 2015-11-18 ENCOUNTER — Ambulatory Visit: Payer: PPO

## 2016-01-02 ENCOUNTER — Other Ambulatory Visit: Payer: Self-pay | Admitting: Family Medicine

## 2016-01-02 DIAGNOSIS — Z1231 Encounter for screening mammogram for malignant neoplasm of breast: Secondary | ICD-10-CM

## 2016-01-03 ENCOUNTER — Other Ambulatory Visit: Payer: Self-pay | Admitting: Family Medicine

## 2016-01-03 DIAGNOSIS — R918 Other nonspecific abnormal finding of lung field: Secondary | ICD-10-CM

## 2016-01-05 ENCOUNTER — Ambulatory Visit: Payer: PPO

## 2016-01-09 ENCOUNTER — Inpatient Hospital Stay: Admission: RE | Admit: 2016-01-09 | Payer: PPO | Source: Ambulatory Visit

## 2017-08-07 ENCOUNTER — Other Ambulatory Visit: Payer: Self-pay | Admitting: Family Medicine

## 2017-08-07 DIAGNOSIS — Z1231 Encounter for screening mammogram for malignant neoplasm of breast: Secondary | ICD-10-CM

## 2017-08-20 ENCOUNTER — Ambulatory Visit: Payer: PPO

## 2017-09-03 ENCOUNTER — Ambulatory Visit: Payer: PPO

## 2017-10-29 ENCOUNTER — Other Ambulatory Visit: Payer: Self-pay | Admitting: Family Medicine

## 2017-10-29 ENCOUNTER — Other Ambulatory Visit (HOSPITAL_COMMUNITY): Payer: Self-pay | Admitting: Family Medicine

## 2017-10-29 DIAGNOSIS — R140 Abdominal distension (gaseous): Secondary | ICD-10-CM

## 2017-10-29 DIAGNOSIS — R5383 Other fatigue: Secondary | ICD-10-CM

## 2017-11-01 ENCOUNTER — Ambulatory Visit (HOSPITAL_COMMUNITY): Payer: Medicare Other

## 2017-11-01 ENCOUNTER — Encounter (HOSPITAL_COMMUNITY): Payer: Self-pay

## 2017-11-19 ENCOUNTER — Ambulatory Visit (HOSPITAL_COMMUNITY): Payer: Medicare Other

## 2017-12-03 ENCOUNTER — Other Ambulatory Visit: Payer: Self-pay | Admitting: Gastroenterology

## 2017-12-03 DIAGNOSIS — R140 Abdominal distension (gaseous): Secondary | ICD-10-CM

## 2017-12-12 ENCOUNTER — Ambulatory Visit
Admission: RE | Admit: 2017-12-12 | Discharge: 2017-12-12 | Disposition: A | Discharge status: Home / Self Care | Payer: Medicare Other | Source: Ambulatory Visit | Attending: Gastroenterology | Admitting: Gastroenterology

## 2017-12-12 DIAGNOSIS — R140 Abdominal distension (gaseous): Secondary | ICD-10-CM

## 2018-10-31 ENCOUNTER — Other Ambulatory Visit: Payer: Self-pay | Admitting: Family Medicine

## 2018-10-31 ENCOUNTER — Ambulatory Visit
Admission: RE | Admit: 2018-10-31 | Discharge: 2018-10-31 | Disposition: A | Discharge status: Home / Self Care | Payer: Medicare Other | Source: Ambulatory Visit | Attending: Family Medicine | Admitting: Family Medicine

## 2018-10-31 ENCOUNTER — Other Ambulatory Visit: Payer: Self-pay

## 2018-10-31 DIAGNOSIS — M7989 Other specified soft tissue disorders: Secondary | ICD-10-CM

## 2018-10-31 DIAGNOSIS — M79672 Pain in left foot: Secondary | ICD-10-CM

## 2019-02-26 ENCOUNTER — Other Ambulatory Visit: Payer: Self-pay | Admitting: Family Medicine

## 2019-02-26 DIAGNOSIS — N644 Mastodynia: Secondary | ICD-10-CM

## 2019-03-06 ENCOUNTER — Other Ambulatory Visit: Payer: Medicare Other

## 2019-05-19 DIAGNOSIS — K859 Acute pancreatitis without necrosis or infection, unspecified: Secondary | ICD-10-CM

## 2019-05-19 HISTORY — DX: Acute pancreatitis without necrosis or infection, unspecified: K85.90

## 2019-06-04 ENCOUNTER — Other Ambulatory Visit: Payer: Self-pay

## 2019-06-04 ENCOUNTER — Encounter (HOSPITAL_COMMUNITY): Payer: Self-pay | Admitting: Emergency Medicine

## 2019-06-04 ENCOUNTER — Inpatient Hospital Stay (HOSPITAL_COMMUNITY)
Admission: EM | Admit: 2019-06-04 | Discharge: 2019-06-08 | DRG: 440 | Disposition: A | Discharge status: Home / Self Care | Payer: Medicare Other | Attending: Family Medicine | Admitting: Family Medicine

## 2019-06-04 ENCOUNTER — Emergency Department (HOSPITAL_COMMUNITY): Payer: Medicare Other

## 2019-06-04 DIAGNOSIS — F17210 Nicotine dependence, cigarettes, uncomplicated: Secondary | ICD-10-CM | POA: Diagnosis present

## 2019-06-04 DIAGNOSIS — Z885 Allergy status to narcotic agent status: Secondary | ICD-10-CM

## 2019-06-04 DIAGNOSIS — Z880 Allergy status to penicillin: Secondary | ICD-10-CM

## 2019-06-04 DIAGNOSIS — K853 Drug induced acute pancreatitis without necrosis or infection: Secondary | ICD-10-CM | POA: Diagnosis present

## 2019-06-04 DIAGNOSIS — R5382 Chronic fatigue, unspecified: Secondary | ICD-10-CM | POA: Diagnosis present

## 2019-06-04 DIAGNOSIS — F418 Other specified anxiety disorders: Secondary | ICD-10-CM | POA: Diagnosis present

## 2019-06-04 DIAGNOSIS — I959 Hypotension, unspecified: Secondary | ICD-10-CM | POA: Diagnosis present

## 2019-06-04 DIAGNOSIS — Z20828 Contact with and (suspected) exposure to other viral communicable diseases: Secondary | ICD-10-CM | POA: Diagnosis present

## 2019-06-04 DIAGNOSIS — Z801 Family history of malignant neoplasm of trachea, bronchus and lung: Secondary | ICD-10-CM

## 2019-06-04 DIAGNOSIS — K859 Acute pancreatitis without necrosis or infection, unspecified: Secondary | ICD-10-CM | POA: Diagnosis present

## 2019-06-04 DIAGNOSIS — K8500 Idiopathic acute pancreatitis without necrosis or infection: Secondary | ICD-10-CM | POA: Diagnosis not present

## 2019-06-04 DIAGNOSIS — H409 Unspecified glaucoma: Secondary | ICD-10-CM | POA: Diagnosis present

## 2019-06-04 DIAGNOSIS — Z9049 Acquired absence of other specified parts of digestive tract: Secondary | ICD-10-CM

## 2019-06-04 DIAGNOSIS — F32A Depression, unspecified: Secondary | ICD-10-CM | POA: Diagnosis present

## 2019-06-04 DIAGNOSIS — R109 Unspecified abdominal pain: Secondary | ICD-10-CM

## 2019-06-04 DIAGNOSIS — Z803 Family history of malignant neoplasm of breast: Secondary | ICD-10-CM

## 2019-06-04 DIAGNOSIS — F329 Major depressive disorder, single episode, unspecified: Secondary | ICD-10-CM | POA: Diagnosis present

## 2019-06-04 DIAGNOSIS — G35 Multiple sclerosis: Secondary | ICD-10-CM | POA: Diagnosis present

## 2019-06-04 DIAGNOSIS — Z8249 Family history of ischemic heart disease and other diseases of the circulatory system: Secondary | ICD-10-CM

## 2019-06-04 LAB — COMPREHENSIVE METABOLIC PANEL
ALT: 20 U/L (ref 0–44)
AST: 19 U/L (ref 15–41)
Albumin: 3.3 g/dL — ABNORMAL LOW (ref 3.5–5.0)
Alkaline Phosphatase: 81 U/L (ref 38–126)
Anion gap: 9 (ref 5–15)
BUN: 8 mg/dL (ref 6–20)
CO2: 21 mmol/L — ABNORMAL LOW (ref 22–32)
Calcium: 8 mg/dL — ABNORMAL LOW (ref 8.9–10.3)
Chloride: 113 mmol/L — ABNORMAL HIGH (ref 98–111)
Creatinine, Ser: 0.8 mg/dL (ref 0.44–1.00)
GFR calc Af Amer: >60 mL/min (ref >60)
GFR calc non Af Amer: >60 mL/min (ref >60)
Glucose, Bld: 190 mg/dL — ABNORMAL HIGH (ref 70–99)
Potassium: 3.7 mmol/L (ref 3.5–5.1)
Sodium: 143 mmol/L (ref 135–145)
Total Bilirubin: 0.7 mg/dL (ref 0.3–1.2)
Total Protein: 5.5 g/dL — ABNORMAL LOW (ref 6.5–8.1)

## 2019-06-04 LAB — SARS CORONAVIRUS 2 (TAT 6-24 HRS): SARS Coronavirus 2: NEGATIVE

## 2019-06-04 LAB — POCT I-STAT EG7
Acid-Base Excess: 1 mmol/L (ref 0.0–2.0)
Bicarbonate: 25.9 mmol/L (ref 20.0–28.0)
Calcium, Ion: 1.08 mmol/L — ABNORMAL LOW (ref 1.15–1.40)
HCT: 43 % (ref 36.0–46.0)
Hemoglobin: 14.6 g/dL (ref 12.0–15.0)
O2 Saturation: 99 %
Potassium: 3.9 mmol/L (ref 3.5–5.1)
Sodium: 142 mmol/L (ref 135–145)
TCO2: 27 mmol/L (ref 22–32)
pCO2, Ven: 40.7 mmHg — ABNORMAL LOW (ref 44.0–60.0)
pH, Ven: 7.412 (ref 7.250–7.430)
pO2, Ven: 137 mmHg — ABNORMAL HIGH (ref 32.0–45.0)

## 2019-06-04 LAB — LACTIC ACID, PLASMA
Lactic Acid, Venous: 1.8 mmol/L (ref 0.5–1.9)
Lactic Acid, Venous: 1.5 mmol/L (ref 0.5–1.9)

## 2019-06-04 LAB — CBC WITH DIFFERENTIAL/PLATELET
Abs Immature Granulocytes: 0.09 10*3/uL — ABNORMAL HIGH (ref 0.00–0.07)
Basophils Absolute: 0.1 10*3/uL (ref 0.0–0.1)
Basophils Relative: 0 %
Eosinophils Absolute: 0.6 10*3/uL — ABNORMAL HIGH (ref 0.0–0.5)
Eosinophils Relative: 4 %
HCT: 41.4 % (ref 36.0–46.0)
Hemoglobin: 13.8 g/dL (ref 12.0–15.0)
Immature Granulocytes: 1 %
Lymphocytes Relative: 22 %
Lymphs Abs: 3.4 10*3/uL (ref 0.7–4.0)
MCH: 30.9 pg (ref 26.0–34.0)
MCHC: 33.3 g/dL (ref 30.0–36.0)
MCV: 92.6 fL (ref 80.0–100.0)
Monocytes Absolute: 0.5 10*3/uL (ref 0.1–1.0)
Monocytes Relative: 3 %
Neutro Abs: 10.9 10*3/uL — ABNORMAL HIGH (ref 1.7–7.7)
Neutrophils Relative %: 70 %
Platelets: 168 10*3/uL (ref 150–400)
RBC: 4.47 MIL/uL (ref 3.87–5.11)
RDW: 13.4 % (ref 11.5–15.5)
WBC: 15.4 10*3/uL — ABNORMAL HIGH (ref 4.0–10.5)
nRBC: 0.3 % — ABNORMAL HIGH (ref 0.0–0.2)

## 2019-06-04 LAB — HIV ANTIBODY (ROUTINE TESTING W REFLEX): HIV Screen 4th Generation wRfx: NONREACTIVE

## 2019-06-04 LAB — LIPASE, BLOOD: Lipase: 525 U/L — ABNORMAL HIGH (ref 11–51)

## 2019-06-04 MED ORDER — IOHEXOL 300 MG/ML  SOLN
100.0000 mL | Freq: Once | INTRAMUSCULAR | Status: AC | PRN
  Administered 2019-06-04: 100 mL via INTRAVENOUS

## 2019-06-04 MED ORDER — GABAPENTIN 300 MG PO CAPS: 300.0000 mg | ORAL_CAPSULE | Freq: Three times a day (TID) | ORAL | Status: DC

## 2019-06-04 MED ORDER — ALPRAZOLAM 0.5 MG PO TABS
0.5000 mg | ORAL_TABLET | Freq: Every evening | ORAL | Status: DC | PRN
  Administered 2019-06-06 – 2019-06-07 (×2): 0.5 mg via ORAL
  Filled 2019-06-04 (×2): qty 1

## 2019-06-04 MED ORDER — AMITRIPTYLINE HCL 10 MG PO TABS
40.0000 mg | ORAL_TABLET | Freq: Every day | ORAL | Status: DC
  Administered 2019-06-06 – 2019-06-07 (×2): 40 mg via ORAL
  Filled 2019-06-04 (×4): qty 4

## 2019-06-04 MED ORDER — HYDROMORPHONE HCL 1 MG/ML IJ SOLN
1.0000 mg | Freq: Once | INTRAMUSCULAR | Status: AC
  Administered 2019-06-04: 13:00:00 1 mg via INTRAVENOUS
  Filled 2019-06-04: qty 1

## 2019-06-04 MED ORDER — ONDANSETRON HCL 4 MG/2ML IJ SOLN
4.0000 mg | Freq: Once | INTRAMUSCULAR | Status: AC
  Administered 2019-06-04: 4 mg via INTRAVENOUS
  Filled 2019-06-04: qty 2

## 2019-06-04 MED ORDER — ACETAMINOPHEN 325 MG PO TABS
650.0000 mg | ORAL_TABLET | Freq: Four times a day (QID) | ORAL | Status: DC | PRN
  Administered 2019-06-06 – 2019-06-08 (×2): 650 mg via ORAL
  Filled 2019-06-04 (×2): qty 2

## 2019-06-04 MED ORDER — SODIUM CHLORIDE 0.9 % IV BOLUS
1000.0000 mL | Freq: Once | INTRAVENOUS | Status: AC
  Administered 2019-06-04: 1000 mL via INTRAVENOUS

## 2019-06-04 MED ORDER — HYDROMORPHONE HCL 1 MG/ML IJ SOLN
0.5000 mg | INTRAMUSCULAR | Status: DC | PRN
  Administered 2019-06-04 – 2019-06-07 (×8): 0.5 mg via INTRAVENOUS
  Filled 2019-06-04 (×9): qty 1

## 2019-06-04 MED ORDER — OXYCODONE-ACETAMINOPHEN 5-325 MG PO TABS
1.0000 | ORAL_TABLET | Freq: Once | ORAL | Status: AC
  Administered 2019-06-04: 1 via ORAL
  Filled 2019-06-04: qty 1

## 2019-06-04 MED ORDER — KETOROLAC TROMETHAMINE 15 MG/ML IJ SOLN: 15.0000 mg | Freq: Four times a day (QID) | INTRAMUSCULAR | Status: DC | PRN

## 2019-06-04 MED ORDER — PROMETHAZINE HCL 25 MG/ML IJ SOLN
12.5000 mg | Freq: Once | INTRAMUSCULAR | Status: AC
  Administered 2019-06-04: 12.5 mg via INTRAVENOUS
  Filled 2019-06-04: qty 1

## 2019-06-04 MED ORDER — DULOXETINE HCL 60 MG PO CPEP: 60.0000 mg | ORAL_CAPSULE | Freq: Every day | ORAL | Status: DC

## 2019-06-04 MED ORDER — ALBUTEROL SULFATE (2.5 MG/3ML) 0.083% IN NEBU: 2.5000 mg | INHALATION_SOLUTION | RESPIRATORY_TRACT | Status: DC | PRN

## 2019-06-04 MED ORDER — ESCITALOPRAM OXALATE 10 MG PO TABS
10.0000 mg | ORAL_TABLET | Freq: Every day | ORAL | Status: DC
  Administered 2019-06-06 – 2019-06-08 (×3): 10 mg via ORAL
  Filled 2019-06-04 (×4): qty 1

## 2019-06-04 MED ORDER — ONDANSETRON HCL 4 MG/2ML IJ SOLN
4.0000 mg | Freq: Four times a day (QID) | INTRAMUSCULAR | Status: DC | PRN
  Administered 2019-06-05 (×2): 4 mg via INTRAVENOUS
  Filled 2019-06-04 (×2): qty 2

## 2019-06-04 MED ORDER — SODIUM CHLORIDE 0.9 % IV SOLN: INTRAVENOUS | Status: AC

## 2019-06-04 MED ORDER — PREDNISONE 20 MG PO TABS: 10.0000 mg | ORAL_TABLET | Freq: Every day | ORAL | Status: DC

## 2019-06-04 MED ORDER — RISAQUAD PO CAPS
1.0000 | ORAL_CAPSULE | Freq: Every morning | ORAL | Status: DC
  Administered 2019-06-06 – 2019-06-08 (×3): 1 via ORAL
  Filled 2019-06-04 (×5): qty 1

## 2019-06-04 MED ORDER — TIMOLOL MALEATE 0.5 % OP SOLN
1.0000 [drp] | Freq: Two times a day (BID) | OPHTHALMIC | Status: DC
  Administered 2019-06-04 – 2019-06-08 (×8): 1 [drp] via OPHTHALMIC
  Filled 2019-06-04 (×4): qty 5

## 2019-06-04 MED ORDER — METOCLOPRAMIDE HCL 5 MG/ML IJ SOLN
10.0000 mg | Freq: Four times a day (QID) | INTRAMUSCULAR | Status: DC | PRN
  Administered 2019-06-04: 10 mg via INTRAVENOUS
  Filled 2019-06-04: qty 2

## 2019-06-04 MED ORDER — DOCUSATE SODIUM 100 MG PO CAPS
100.0000 mg | ORAL_CAPSULE | Freq: Every day | ORAL | Status: DC | PRN
  Administered 2019-06-07: 100 mg via ORAL
  Filled 2019-06-04: qty 1

## 2019-06-04 NOTE — ED Notes (Signed)
Pt back from CT

## 2019-06-04 NOTE — ED Triage Notes (Addendum)
Pt arrives to ED from home with complaints of sudden onset of sharp abdominal pain just above the navel at 500am this morning. Patient had multiple emesis episodes and is currently nauseous. Patient blood pressure was 15'P systolic with EMS, now WNL after 500cc bolus. Patient received 64mcg of fentanyl but has not relieved pain.

## 2019-06-04 NOTE — H&P (Addendum)
History and Physical    URA YINGLING SHF:[id number] DOB: 1961/03/13 DOA: 06/04/2019  PCP: Richmond Campbell., PA-C   Patient coming from: Home  I have personally briefly reviewed patient's old medical records in [facility]  Chief Complaint: Stomach pain nauseous vomit  HPI: Alyssa Gilbert is a 58 y.o. female with medical history significant of MS in remission, anxiety depression, scented with new onset of epigastric pain.  Patient woke up around 5 o'clock this morning feeling nauseous, vomitted once with stomach content no bowel no blood no coffee-ground stuff.  She went back to sleep however then around 7 o'clock with severe cramping like epigastric abdominal pain 10/10, then she vomited again stomach content.  Pain became constant, and she has had more frequent nauseous and vomiting.  She denies any diarrhea no fever or chills.  No chest pain, no cough no short of breath.  She says she quit drink 8 years ago. ED Course: CT of the brain showed no gallstone, no CBD dilatation.  Review of Systems: As per HPI otherwise 10 point review of systems negative.   Past Medical History:  Diagnosis Date  . Glaucoma   . Headache   . History of appendectomy   . MS (multiple sclerosis) (HCC)   . Neuropathy     Past Surgical History:  Procedure Laterality Date  . APPENDECTOMY  1980     reports that she has been smoking cigarettes. She has been smoking about 1.00 pack per day. She has never used smokeless tobacco. She reports current drug use. Drug: Marijuana. She reports that she does not drink alcohol.  Allergies  Allergen Reactions  . Codeine Nausea And Vomiting  . Oxycodone Nausea And Vomiting  . Penicillins Hives    Did it involve swelling of the face/tongue/throat, SOB, or low BP? No Did it involve sudden or severe rash/hives, skin peeling, or any reaction on the inside of your mouth or nose? No Did you need to seek medical attention at a hospital or doctor's office?  No When did it last happen?young child If all above answers are "NO", may proceed with cephalosporin use.    Family History  Problem Relation Age of Onset  . Lung cancer Unknown   . Breast cancer Mother   . Hypertension Mother   . Lung cancer Father      Prior to Admission medications   Medication Sig Start Date End Date Taking? Authorizing Provider  ALPRAZolam Prudy Feeler) 0.5 MG tablet Take 0.5 mg by mouth at bedtime as needed for sleep (migraines). as directed 06/28/14  Yes [provider]  amitriptyline (ELAVIL) 10 MG tablet Take 40 mg by mouth at bedtime. 02/16/19  Yes [provider]  Docusate Calcium (STOOL SOFTENER PO) Take 100 mg by mouth daily as needed (constipation).    Yes [provider]  escitalopram (LEXAPRO) 10 MG tablet Take 10 mg by mouth daily. 12/21/14  Yes [provider]  ibuprofen (ADVIL,MOTRIN) 200 MG tablet Take 200 mg by mouth every 6 (six) hours as needed for mild pain or moderate pain.   Yes [provider]  Lactobacillus (PROBIOTIC ACIDOPHILUS PO) Take 1 tablet by mouth daily.    Yes [provider]  natalizumab (TYSABRI) 300 MG/15ML injection Inject 300 mg into the vein every 30 (thirty) days. 10/06/18  Yes [provider]  PROAIR HFA 108 (90 BASE) MCG/ACT inhaler Inhale 1-2 puffs into the lungs every 4 (four) hours as needed for wheezing or shortness of breath.  12/17/14  Yes [provider]  timolol (TIMOPTIC) 0.5 % ophthalmic solution Place 1 drop into both eyes 2 (two) times daily. 12/21/14  Yes [provider]  VITAMIN D, CHOLECALCIFEROL, PO Take 5,000 Units by mouth daily.    Yes [provider]  DULoxetine (CYMBALTA) 60 MG capsule Take 1 capsule (60 mg total) by mouth daily. Patient not taking: Reported on 01/10/2015 07/22/14   Asa LenteSater, Richard A, MD  gabapentin (NEURONTIN) 300 MG capsule 1 capsule qhs for 3 days then increase to bid for 3 days then increase to 1 capsule  three times a day Patient not taking: Reported on 07/22/2014 12/29/13   Ramond MarrowSumner, Peter J, DO  interferon beta-1a (AVONEX) 30 MCG injection Inject 30 mcg into the muscle every 7 (seven) days. Patient not taking: Reported on 06/04/2019 07/22/14   Asa LenteSater, Richard A, MD  naproxen (NAPROSYN) 500 MG tablet Take 1 tablet (500 mg total) by mouth 2 (two) times daily with a meal. Patient not taking: Reported on 06/04/2019 01/10/15   Toy Cookeyocherty, Megan, MD  predniSONE (DELTASONE) 10 MG tablet Take 1 tablet (10 mg total) by mouth daily. Take 1 tab daily for 14 days, then decrease to 1/2 tab daily for 14 days and then stop Patient not taking: Reported on 07/22/2014 12/29/13   Ramond MarrowSumner, Peter J, DO    Physical Exam: Vitals:   06/04/19 1351 06/04/19 1400 06/04/19 1430 06/04/19 1615  BP: (!) 133/54 124/61 128/63 (!) 110/94  Pulse: 89 85 76 93  Resp: 15   16  Temp:    (!) 97.5 F (36.4 C)  TempSrc:    Oral  SpO2: 94% 91% 91% 96%  Weight:        Constitutional: NAD, calm, comfortable Vitals:   06/04/19 1351 06/04/19 1400 06/04/19 1430 06/04/19 1615  BP: (!) 133/54 124/61 128/63 (!) 110/94  Pulse: 89 85 76 93  Resp: 15   16  Temp:    (!) 97.5 F (36.4 C)  TempSrc:    Oral  SpO2: 94% 91% 91% 96%  Weight:       Eyes: PERRL, lids and conjunctivae normal ENMT: Mucous membranes are moist. Posterior pharynx clear of any exudate or lesions.Normal dentition.  Neck: normal, supple, no masses, no thyromegaly Respiratory: clear to auscultation bilaterally, no wheezing, no crackles. Normal respiratory effort. No accessory muscle use.  Cardiovascular: Regular rate and rhythm, no murmurs / rubs / gallops. No extremity edema. 2+ pedal pulses. No carotid bruits.  Abdomen: tenderness on deep palpation epigastric area, no rebound no guarding, no masses palpated. No hepatosplenomegaly. Bowel sounds positive.  Musculoskeletal: no clubbing / cyanosis. No joint deformity upper and lower extremities. Good ROM, no contractures.  Normal muscle tone.  Skin: no rashes, lesions, ulcers. No induration Neurologic: CN 2-12 grossly intact. Sensation intact, DTR normal. Strength 5/5 in all 4.  Psychiatric: Normal judgment and insight. Alert and oriented x 3. Normal mood.    Labs on Admission: I have personally reviewed following labs and imaging studies  CBC: Recent Labs  Lab 06/04/19 0937 06/04/19 1023  WBC 15.4*  --   NEUTROABS 10.9*  --   HGB 13.8 14.6  HCT 41.4 43.0  MCV 92.6  --   PLT 168  --    Basic Metabolic Panel: Recent Labs  Lab 06/04/19 0937 06/04/19 1023  NA 143 142  K 3.7 3.9  CL 113*  --   CO2 21*  --   GLUCOSE 190*  --   BUN 8  --  CREATININE 0.80  --   CALCIUM 8.0*  --    GFR: CrCl cannot be calculated (Unknown ideal weight.). Liver Function Tests: Recent Labs  Lab 06/04/19 0937  AST 19  ALT 20  ALKPHOS 81  BILITOT 0.7  PROT 5.5*  ALBUMIN 3.3*   Recent Labs  Lab 06/04/19 1300  LIPASE 525*   No results for input(s): AMMONIA in the last 168 hours. Coagulation Profile: No results for input(s): INR, PROTIME in the last 168 hours. Cardiac Enzymes: No results for input(s): CKTOTAL, CKMB, CKMBINDEX, TROPONINI in the last 168 hours. BNP (last 3 results) No results for input(s): PROBNP in the last 8760 hours. HbA1C: No results for input(s): HGBA1C in the last 72 hours. CBG: No results for input(s): GLUCAP in the last 168 hours. Lipid Profile: No results for input(s): CHOL, HDL, LDLCALC, TRIG, CHOLHDL, LDLDIRECT in the last 72 hours. Thyroid Function Tests: No results for input(s): TSH, T4TOTAL, FREET4, T3FREE, THYROIDAB in the last 72 hours. Anemia Panel: No results for input(s): VITAMINB12, FOLATE, FERRITIN, TIBC, IRON, RETICCTPCT in the last 72 hours. Urine analysis: No results found for: COLORURINE, APPEARANCEUR, LABSPEC, PHURINE, GLUCOSEU, HGBUR, BILIRUBINUR, KETONESUR, PROTEINUR, UROBILINOGEN, NITRITE, LEUKOCYTESUR  Radiological Exams on Admission: CT Abdomen  Pelvis W Contrast  Result Date: 06/04/2019 CLINICAL DATA:  Right upper quadrant abdominal pain EXAM: CT ABDOMEN AND PELVIS WITH CONTRAST TECHNIQUE: Multidetector CT imaging of the abdomen and pelvis was performed using the standard protocol following bolus administration of intravenous contrast. CONTRAST:  130mL OMNIPAQUE IOHEXOL 300 MG/ML  SOLN COMPARISON:  None. FINDINGS: Lower chest: No acute abnormality. Hepatobiliary: No focal liver abnormality is seen. No gallstones, gallbladder wall thickening, or biliary dilatation. Pancreas: Inflammatory changes within the pancreaticoduodenal groove. Pancreatic parenchyma enhances without evidence of pancreatic necrosis. Body and tail of the pancreas appear unremarkable. Main pancreatic duct is upper limits of normal in size. Spleen: Normal in size without focal abnormality. Adrenals/Urinary Tract: Adrenal glands are unremarkable. Kidneys are normal, without renal calculi, focal lesion, or hydronephrosis. Bladder is unremarkable. Stomach/Bowel: Small hiatal hernia. The first, second, and proximal third portions of the duodenum are circumferentially thickened. There is surrounding fat stranding in a small amount of adjacent free fluid. No abscess. No extraluminal air. No bowel obstruction. Colon is largely decompressed. No focal colonic thickening or pericolonic inflammatory changes. Vascular/Lymphatic: Aortic atherosclerosis. Incidental note of a retroaortic left renal vein, an anatomic variant. No enlarged abdominal or pelvic lymph nodes. Reproductive: Uterus and bilateral adnexa are unremarkable. Other: Tiny fat containing umbilical hernia. Musculoskeletal: No acute or significant osseous findings. IMPRESSION: 1. Inflammatory changes adjacent to the duodenum and within the pancreaticoduodenal groove with a small volume of upper abdominal free fluid. Findings may represent acute duodenitis secondary to infection, inflammation, versus ulcer. Groove pancreatitis is also a  consideration. Correlation with serum lipase is recommended. 2. Small hiatal hernia. 3. Aortic atherosclerosis. Aortic Atherosclerosis (ICD10-I70.0). Electronically Signed   By: Davina Poke M.D.   On: 06/04/2019 11:50    EKG: Ordered  Assessment/Plan Active Problems:   Acute pancreatitis  Acute pancreatitis, etiology unknown, does not look like biliary related, patient denied any alcohol drink recent years.  Send lipid panel to rule out hypertriglyceridemia, cussed case with [facility] attending, recommend hold off MRCP for tonight, supportive care with IVF and n.p.o. pain meds Zofran, Reglan.  Leukocytosis, with mild left shift, with normal lactic acid and no fever, likely reactive to acute pancreatitis, hold off antibiotics.  MS, in remission, review of literature guarding patient's immune  modulation medication, did not show significant correlation of her MS meds with acute pancreatitis.  This was discussed with GI attending as well.  Anxiety depression, continue SSRI.  DVT prophylaxis: SCD and ambulation Code Status: Full code Family Communication: None at bedside  disposition Plan: Home if tolerated p.o. tomorrow Consults called:[facility] Admission status: MedSurg observation   Emeline GeneralPing T Velvie Thomaston MD [facility] Pager [phone]  If 7PM-7AM, please contact night-coverage [email or web address] Password [email or web address]  06/04/2019, 6:27 PM

## 2019-06-04 NOTE — ED Provider Notes (Signed)
[facility] EMERGENCY DEPARTMENT Provider Note   CSN: [id number] Arrival date & time: 06/04/19  3009     History Chief Complaint  Patient presents with  . Abdominal Pain    Alyssa Gilbert is a 58 y.o. female.  HPI     She presents for evaluation of upper abdominal pain which started this morning after she awoke.  It was associated with several episodes of vomiting and dizziness.  She was treated by EMS and transported.  She was found to be hypotensive "in the 80s," and treated with IV fluids with improvement.  She denies diarrhea, fever, chills, cough, shortness of breath, known sick exposure.  No prior similar problems.  She has had a appendectomy, no other abdominal surgeries.  She did not take any of her morning medications today.  She is a smoker.  There are no other known modifying factors.  Past Medical History:  Diagnosis Date  . Glaucoma   . Headache   . History of appendectomy   . MS (multiple sclerosis) ([place])   . Neuropathy     Patient Active Problem List   Diagnosis Date Noted  . Multiple sclerosis ([place]) 07/22/2014  . Depression 07/22/2014  . Chronic fatigue 07/22/2014  . Other headache syndrome 07/22/2014  . Optic neuritis 07/22/2014    Past Surgical History:  Procedure Laterality Date  . APPENDECTOMY  1980     OB History   No obstetric history on file.     Family History  Problem Relation Age of Onset  . Lung cancer Unknown   . Breast cancer Mother   . Hypertension Mother   . Lung cancer Father     Social History   Tobacco Use  . Smoking status: Current Every Day Smoker    Packs/day: 1.00    Types: Cigarettes  . Smokeless tobacco: Never Used  Substance Use Topics  . Alcohol use: No    Comment: quit 2010  . Drug use: Yes    Types: Marijuana    Comment: 2-3 times per day    Home Medications Prior to Admission medications   Medication Sig Start Date End Date Taking? Authorizing Provider  ALPRAZolam Duanne Moron) 0.5  MG tablet Take 0.5 mg by mouth at bedtime as needed for sleep (migraines). as directed 06/28/14  Yes [provider]  amitriptyline (ELAVIL) 10 MG tablet Take 40 mg by mouth at bedtime. 02/16/19  Yes [provider]  Docusate Calcium (STOOL SOFTENER PO) Take 100 mg by mouth daily as needed (constipation).    Yes [provider]  escitalopram (LEXAPRO) 10 MG tablet Take 10 mg by mouth daily. 12/21/14  Yes [provider]  ibuprofen (ADVIL,MOTRIN) 200 MG tablet Take 200 mg by mouth every 6 (six) hours as needed for mild pain or moderate pain.   Yes [provider]  Lactobacillus (PROBIOTIC ACIDOPHILUS PO) Take 1 tablet by mouth daily.    Yes [provider]  natalizumab (TYSABRI) 300 MG/15ML injection Inject 300 mg into the vein every 30 (thirty) days. 10/06/18  Yes [provider]  PROAIR HFA 108 (90 BASE) MCG/ACT inhaler Inhale 1-2 puffs into the lungs every 4 (four) hours as needed for wheezing or shortness of breath.  12/17/14  Yes [provider]  timolol (TIMOPTIC) 0.5 % ophthalmic solution Place 1 drop into both eyes 2 (two) times daily. 12/21/14  Yes [provider]  VITAMIN D, CHOLECALCIFEROL, PO Take 5,000 Units by mouth daily.    Yes [provider]  DULoxetine (CYMBALTA) 60 MG capsule Take 1 capsule (60 mg total) by mouth daily. Patient not taking: Reported on 01/10/2015 07/22/14   Asa LenteSater, Richard A, MD  gabapentin (NEURONTIN) 300 MG capsule 1 capsule qhs for 3 days then increase to bid for 3 days then increase to 1 capsule three times a day Patient not taking: Reported on 07/22/2014 12/29/13   Ramond MarrowSumner, Peter J, DO  interferon beta-1a (AVONEX) 30 MCG injection Inject 30 mcg into the muscle every 7 (seven) days. Patient not taking: Reported on 06/04/2019 07/22/14   Asa LenteSater, Richard A, MD  naproxen (NAPROSYN) 500 MG tablet Take 1 tablet (500 mg total) by mouth 2 (two) times daily with a meal. Patient not taking: Reported on  06/04/2019 01/10/15   Toy Cookeyocherty, Megan, MD  predniSONE (DELTASONE) 10 MG tablet Take 1 tablet (10 mg total) by mouth daily. Take 1 tab daily for 14 days, then decrease to 1/2 tab daily for 14 days and then stop Patient not taking: Reported on 07/22/2014 12/29/13   Ramond MarrowSumner, Peter J, DO    Allergies    Codeine, Oxycodone, and Penicillins  Review of Systems   Review of Systems  All other systems reviewed and are negative.   Physical Exam Updated Vital Signs BP (!) 110/94 (BP Location: Left Arm)   Pulse 93   Temp (!) 97.5 F (36.4 C) (Oral)   Resp 16   Wt 68 kg   SpO2 96%   BMI 23.15 kg/m   Physical Exam Vitals and nursing note reviewed.  Constitutional:      General: She is not in acute distress.    Appearance: She is well-developed. She is ill-appearing. She is not toxic-appearing or diaphoretic.  HENT:     Head: Normocephalic and atraumatic.     Right Ear: External ear normal.     Left Ear: External ear normal.  Eyes:     Conjunctiva/sclera: Conjunctivae normal.     Pupils: Pupils are equal, round, and reactive to light.  Neck:     Trachea: Phonation normal.  Cardiovascular:     Rate and Rhythm: Normal rate and regular rhythm.     Heart sounds: Normal heart sounds.  Pulmonary:     Effort: Pulmonary effort is normal. No respiratory distress.     Breath sounds: Normal breath sounds. No stridor. No wheezing or rhonchi.  Abdominal:     General: There is no distension.     Palpations: Abdomen is soft. There is no mass.     Tenderness: There is abdominal tenderness (Moderate). There is guarding. There is no rebound.     Hernia: No hernia is present.  Musculoskeletal:        General: Normal range of motion.     Cervical back: Normal range of motion and neck supple.  Skin:    General: Skin is warm and dry.  Neurological:     Mental Status: She is alert and oriented to person, place, and time.     Cranial Nerves: No cranial nerve deficit.     Sensory: No sensory deficit.      Motor: No abnormal muscle tone.     Coordination: Coordination normal.  Psychiatric:        Mood and Affect: Mood normal.        Behavior: Behavior normal.        Thought Content: Thought content normal.        Judgment: Judgment normal.     ED Results / Procedures /  Treatments   Labs (all labs ordered are listed, but only abnormal results are displayed) Labs Reviewed  COMPREHENSIVE METABOLIC PANEL - Abnormal; Notable for the following components:      Result Value   Chloride 113 (*)    CO2 21 (*)    Glucose, Bld 190 (*)    Calcium 8.0 (*)    Total Protein 5.5 (*)    Albumin 3.3 (*)    All other components within normal limits  CBC WITH DIFFERENTIAL/PLATELET - Abnormal; Notable for the following components:   WBC 15.4 (*)    nRBC 0.3 (*)    Neutro Abs 10.9 (*)    Eosinophils Absolute 0.6 (*)    Abs Immature Granulocytes 0.09 (*)    All other components within normal limits  LIPASE, BLOOD - Abnormal; Notable for the following components:   Lipase 525 (*)    All other components within normal limits  POCT I-STAT EG7 - Abnormal; Notable for the following components:   pCO2, Ven 40.7 (*)    pO2, Ven 137.0 (*)    Calcium, Ion 1.08 (*)    All other components within normal limits  SARS CORONAVIRUS 2 (TAT 6-24 HRS)  CULTURE, BLOOD (ROUTINE X 2)  CULTURE, BLOOD (ROUTINE X 2)  LACTIC ACID, PLASMA  LACTIC ACID, PLASMA  BLOOD GAS, VENOUS    EKG EKG Interpretation  Date/Time:  Thursday June 04 2019 09:20:03 EST Ventricular Rate:  74 PR Interval:    QRS Duration: 111 QT Interval:  454 QTC Calculation: 504 R Axis:   93 Text Interpretation: Sinus rhythm Borderline right axis deviation Borderline prolonged QT interval Since last tracing QT has lengthened Otherwise no significant change Confirmed by Mancel Bale [phone]) on 06/04/2019 9:28:17 AM Also confirmed by Mancel Bale [phone]), editor Elita Quick ([id number])  on 06/04/2019 2:30:47 PM   Radiology CT  Abdomen Pelvis W Contrast  Result Date: 06/04/2019 CLINICAL DATA:  Right upper quadrant abdominal pain EXAM: CT ABDOMEN AND PELVIS WITH CONTRAST TECHNIQUE: Multidetector CT imaging of the abdomen and pelvis was performed using the standard protocol following bolus administration of intravenous contrast. CONTRAST:  OMNIPAQUE IOHEXOL 300 MG/ML  SOLN COMPARISON:  None. FINDINGS: Lower chest: No acute abnormality. Hepatobiliary: No focal liver abnormality is seen. No gallstones, gallbladder wall thickening, or biliary dilatation. Pancreas: Inflammatory changes within the pancreaticoduodenal groove. Pancreatic parenchyma enhances without evidence of pancreatic necrosis. Body and tail of the pancreas appear unremarkable. Main pancreatic duct is upper limits of normal in size. Spleen: Normal in size without focal abnormality. Adrenals/Urinary Tract: Adrenal glands are unremarkable. Kidneys are normal, without renal calculi, focal lesion, or hydronephrosis. Bladder is unremarkable. Stomach/Bowel: Small hiatal hernia. The first, second, and proximal third portions of the duodenum are circumferentially thickened. There is surrounding fat stranding in a small amount of adjacent free fluid. No abscess. No extraluminal air. No bowel obstruction. Colon is largely decompressed. No focal colonic thickening or pericolonic inflammatory changes. Vascular/Lymphatic: Aortic atherosclerosis. Incidental note of a retroaortic left renal vein, an anatomic variant. No enlarged abdominal or pelvic lymph nodes. Reproductive: Uterus and bilateral adnexa are unremarkable. Other: Tiny fat containing umbilical hernia. Musculoskeletal: No acute or significant osseous findings. IMPRESSION: 1. Inflammatory changes adjacent to the duodenum and within the pancreaticoduodenal groove with a small volume of upper abdominal free fluid. Findings may represent acute duodenitis secondary to infection, inflammation, versus ulcer. Groove pancreatitis  is also a consideration. Correlation with serum lipase is recommended. 2. Small hiatal hernia. 3. Aortic atherosclerosis. Aortic  Atherosclerosis (ICD10-I70.0). Electronically Signed   By: Duanne Guess M.D.   On: 06/04/2019 11:50    Procedures .Critical Care Performed by: Mancel Bale, MD Authorized by: Mancel Bale, MD   Critical care provider statement:    Critical care time (minutes):  35   Critical care start time:  06/04/2019 9:20 AM   Critical care end time:  06/04/2019 4:43 PM   Critical care time was exclusive of:  Separately billable procedures and treating other patients   Critical care was time spent personally by me on the following activities:  Blood draw for specimens, development of treatment plan with patient or surrogate, discussions with consultants, evaluation of patient's response to treatment, examination of patient, obtaining history from patient or surrogate, ordering and performing treatments and interventions, ordering and review of laboratory studies, pulse oximetry, re-evaluation of patient's condition, review of old charts and ordering and review of radiographic studies   (including critical care time)  Medications Ordered in ED Medications  sodium chloride 0.9 % bolus 1,000 mL (0 mLs Intravenous Stopped 06/04/19 1126)  iohexol (OMNIPAQUE) 300 MG/ML solution 100 mL (100 mLs Intravenous Contrast Given 06/04/19 1136)  HYDROmorphone (DILAUDID) injection 1 mg (1 mg Intravenous Given 06/04/19 1300)  ondansetron (ZOFRAN) injection 4 mg (4 mg Intravenous Given 06/04/19 1255)  ondansetron (ZOFRAN) injection 4 mg (4 mg Intravenous Given 06/04/19 1602)  oxyCODONE-acetaminophen (PERCOCET/ROXICET) 5-325 MG per tablet 1 tablet (1 tablet Oral Given 06/04/19 1602)    ED Course  I have reviewed the triage vital signs and the nursing notes.  Pertinent labs & imaging results that were available during my care of the patient were reviewed by me and considered in my  medical decision making (see chart for details).  Clinical Course as of Jun 04 1643  Thu Jun 04, 2019  0940 Initial clinical impression is nonspecific abdominal pain, with transient hypotension likely related to vagal effect secondary to pain.  Patient responded to a 500 cc fluid bolus, with normal blood pressure, when seen in the ED.  Will evaluate for acute intra-abdominal abnormalities with CT imaging, screening blood work and send a Covid test.   [EW]  1519 Elevated  Lipase(!) [EW]  1519 Normal except white count high  CBC with Differential(!) [EW]  1519 Normal  Lactic acid, plasma [EW]  1519 Normal except chloride high, CO2 low, glucose high, calcium low, total protein low, albumin low  Comprehensive metabolic panel(!) [EW]  1519 Normal except PCO2 low, PO2 high on venous gas testing  POCT I-Stat EG7(!) [EW]  1520 Per radiologist, consistent with pancreatitis.  Consider duodenitis.   [EW]  1527 At this time she is tolerating oral water, and would like to try going home.  We will give another dose of antiemetic, oral Percocet, to make sure she can tolerate it.   [EW]    Clinical Course User Index [EW] Mancel Bale, MD   MDM Rules/Calculators/A&P                       Patient Vitals for the past 24 hrs:  BP Temp Temp src Pulse Resp SpO2 Weight  06/04/19 1615 (!) 110/94 (!) 97.5 F (36.4 C) Oral 93 16 96 % --  06/04/19 1430 128/63 -- -- 76 -- 91 % --  06/04/19 1400 124/61 -- -- 85 -- 91 % --  06/04/19 1351 (!) 133/54 -- -- 89 15 94 % --  06/04/19 1100 -- -- -- 82 (!) 26 100 % --  06/04/19 0945 110/69 -- -- 69 15 98 % --  06/04/19 0926 -- (!) 96 F (35.6 C) Rectal -- -- -- --  06/04/19 0923 -- -- -- -- -- -- 68 kg  06/04/19 0921 128/69 (!) 96.1 F (35.6 C) Axillary 73 (!) 29 97 % --    4:35PM Reevaluation with update and discussion. After initial assessment and treatment, an updated evaluation reveals she continues to vomit after attempting to take oral Percocet.  Will  arrange for admission.  Findings discussed with the patient and all questions were answered. Mancel BaleElliott Dymir Neeson   Medical Decision Making: Acute pancreatitis.  Patient states she is not a drinker.  Unclear cause.  No prior similar.  Unable to tolerate oral fluids with markedly elevated lipase greater than 500.  She will require observation admission and stabilization with fluids and analgesia and antiemetics.  Vernice Jeffersonlizabeth A Keatts was evaluated in Emergency Department on 06/04/2019 for the symptoms described in the history of present illness. She was evaluated in the context of the global COVID-19 pandemic, which necessitated consideration that the patient might be at risk for infection with the SARS-CoV-2 virus that causes COVID-19. Institutional protocols and algorithms that pertain to the evaluation of patients at risk for COVID-19 are in a state of rapid change based on information released by regulatory bodies including the CDC and federal and state organizations. These policies and algorithms were followed during the patient's care in the ED.   CRITICAL CARE-Yes Performed by: Mancel BaleElliott Earlean Fidalgo   Nursing Notes Reviewed/ Care Coordinated Applicable Imaging Reviewed Interpretation of Laboratory Data incorporated into ED treatment   4:43 PM-Consult complete with hospitalist. Patient case explained and discussed.  He agrees to admit patient for further evaluation and treatment. Call ended at 4:52 PM  Plan: Admit  Final Clinical Impression(s) / ED Diagnoses Final diagnoses:  Acute pancreatitis without infection or necrosis, unspecified pancreatitis type    Rx / DC Orders ED Discharge Orders    None       Mancel BaleWentz, Jacobe Study, MD 06/04/19 1753

## 2019-06-04 NOTE — ED Notes (Signed)
Patient transported to CT 

## 2019-06-04 NOTE — ED Notes (Signed)
Pt threw up very soon after medicine was provided as vitals were being taken

## 2019-06-05 ENCOUNTER — Encounter (HOSPITAL_COMMUNITY): Payer: Self-pay | Admitting: Internal Medicine

## 2019-06-05 ENCOUNTER — Observation Stay (HOSPITAL_COMMUNITY): Payer: Medicare Other

## 2019-06-05 DIAGNOSIS — Z801 Family history of malignant neoplasm of trachea, bronchus and lung: Secondary | ICD-10-CM | POA: Diagnosis not present

## 2019-06-05 DIAGNOSIS — K859 Acute pancreatitis without necrosis or infection, unspecified: Secondary | ICD-10-CM | POA: Diagnosis present

## 2019-06-05 DIAGNOSIS — Z20828 Contact with and (suspected) exposure to other viral communicable diseases: Secondary | ICD-10-CM | POA: Diagnosis present

## 2019-06-05 DIAGNOSIS — G35 Multiple sclerosis: Secondary | ICD-10-CM

## 2019-06-05 DIAGNOSIS — R5382 Chronic fatigue, unspecified: Secondary | ICD-10-CM | POA: Diagnosis present

## 2019-06-05 DIAGNOSIS — Z8249 Family history of ischemic heart disease and other diseases of the circulatory system: Secondary | ICD-10-CM | POA: Diagnosis not present

## 2019-06-05 DIAGNOSIS — K8500 Idiopathic acute pancreatitis without necrosis or infection: Secondary | ICD-10-CM | POA: Diagnosis present

## 2019-06-05 DIAGNOSIS — Z880 Allergy status to penicillin: Secondary | ICD-10-CM | POA: Diagnosis not present

## 2019-06-05 DIAGNOSIS — Z885 Allergy status to narcotic agent status: Secondary | ICD-10-CM | POA: Diagnosis not present

## 2019-06-05 DIAGNOSIS — H409 Unspecified glaucoma: Secondary | ICD-10-CM | POA: Diagnosis present

## 2019-06-05 DIAGNOSIS — Z9049 Acquired absence of other specified parts of digestive tract: Secondary | ICD-10-CM | POA: Diagnosis not present

## 2019-06-05 DIAGNOSIS — K853 Drug induced acute pancreatitis without necrosis or infection: Secondary | ICD-10-CM | POA: Diagnosis present

## 2019-06-05 DIAGNOSIS — F418 Other specified anxiety disorders: Secondary | ICD-10-CM | POA: Diagnosis present

## 2019-06-05 DIAGNOSIS — F17210 Nicotine dependence, cigarettes, uncomplicated: Secondary | ICD-10-CM | POA: Diagnosis present

## 2019-06-05 DIAGNOSIS — I959 Hypotension, unspecified: Secondary | ICD-10-CM | POA: Diagnosis present

## 2019-06-05 DIAGNOSIS — Z803 Family history of malignant neoplasm of breast: Secondary | ICD-10-CM | POA: Diagnosis not present

## 2019-06-05 LAB — HEMOGLOBIN A1C
Hgb A1c MFr Bld: 6.2 % — ABNORMAL HIGH (ref 4.8–5.6)
Mean Plasma Glucose: 131.24 mg/dL

## 2019-06-05 LAB — LIPID PANEL
Cholesterol: 240 mg/dL — ABNORMAL HIGH (ref 0–200)
HDL: 39 mg/dL — ABNORMAL LOW (ref >40)
LDL Cholesterol: 185 mg/dL — ABNORMAL HIGH (ref 0–99)
Total CHOL/HDL Ratio: 6.2 RATIO
Triglycerides: 80 mg/dL (ref <150)
VLDL: 16 mg/dL (ref 0–40)

## 2019-06-05 MED ORDER — PANTOPRAZOLE SODIUM 40 MG IV SOLR
40.0000 mg | Freq: Two times a day (BID) | INTRAVENOUS | Status: DC
  Administered 2019-06-05 – 2019-06-06 (×4): 40 mg via INTRAVENOUS
  Filled 2019-06-05 (×4): qty 40

## 2019-06-05 MED ORDER — ENOXAPARIN SODIUM 40 MG/0.4ML ~~LOC~~ SOLN
40.0000 mg | SUBCUTANEOUS | Status: DC
  Administered 2019-06-05 – 2019-06-07 (×3): 40 mg via SUBCUTANEOUS
  Filled 2019-06-05 (×3): qty 0.4

## 2019-06-05 NOTE — Progress Notes (Signed)
Progress Note    Alyssa Jeffersonlizabeth A Turano  GNF:[id number] DOB: 09-20-1960  DOA: 06/04/2019 PCP: Richmond CampbellKaplan, Kristen W., PA-C    Brief Narrative:     Medical records reviewed and are as summarized below:  Alyssa Gilbert is an 58 y.o. female with medical history significant of MS in remission, anxiety depression, presented with new onset of epigastric pain.  Patient woke up around 5 o'clock this morning feeling nauseous, vomitted once with stomach content no bowel no blood no coffee-ground stuff.  She went back to sleep however then around 7 o'clock with severe cramping like epigastric abdominal pain 10/10, then she vomited again stomach content.  Pain became constant, and she has had more frequent nauseous and vomiting.    Assessment/Plan:   Active Problems:   Acute pancreatitis   Acute pancreatitis -lipase >500 -CT Scan: Inflammatory changes adjacent to the duodenum and within the pancreaticoduodenal groove with a small volume of upper abdominal free fluid. Findings may represent acute duodenitis secondary to infection, inflammation, versus ulcer. Groove pancreatitis is also a consideration. -lipids w/o triglyceride elevation -denies alcohol -RUQ pending -medication reviewed -GI consult appreciated -IVF and pain control -NPO for now-- clears after seen by GI  Leukocytosis -likely reactive to acute pancreatitis - hold off antibiotics.  MS - in remission -no significant correlation of her MS meds with acute pancreatitis.  This was discussed with GI attending as well.  Anxiety/depression - continue SSRI.   Family Communication/Anticipated D/C date and plan/Code Status   DVT prophylaxis: scd Code Status: Full Code.  Family Communication:  Disposition Plan: pending improvement   Medical Consultants:   GI    Subjective:   Abdomen painful  Objective:    Vitals:   06/04/19 1430 06/04/19 1615 06/04/19 2045 06/05/19 0535  BP: 128/63 (!) 110/94 (!) 148/74  121/70  Pulse: 76 93 (!) 102 92  Resp:  16 20 16   Temp:  (!) 97.5 F (36.4 C) 97.7 F (36.5 C) 98.1 F (36.7 C)  TempSrc:  Oral Oral Oral  SpO2: 91% 96% 100% 95%  Weight:        Intake/Output Summary (Last 24 hours) at 06/05/2019 1007 Last data filed at 06/05/2019 0920 Gross per 24 hour  Intake 1000 ml  Output --  Net 1000 ml   Filed Weights   06/04/19 0923  Weight: 68 kg    Exam: In bed, pleasant and cooperative A+Ox3 Abdomen tender to even light palpation No LE edema  Data Reviewed:   I have personally reviewed following labs and imaging studies:  Labs: Labs show the following:   Basic Metabolic Panel: Recent Labs  Lab 06/04/19 0937 06/04/19 1023  NA 143 142  K 3.7 3.9  CL 113*  --   CO2 21*  --   GLUCOSE 190*  --   BUN 8  --   CREATININE 0.80  --   CALCIUM 8.0*  --    GFR CrCl cannot be calculated (Unknown ideal weight.). Liver Function Tests: Recent Labs  Lab 06/04/19 0937  AST 19  ALT 20  ALKPHOS 81  BILITOT 0.7  PROT 5.5*  ALBUMIN 3.3*   Recent Labs  Lab 06/04/19 1300  LIPASE 525*   No results for input(s): AMMONIA in the last 168 hours. Coagulation profile No results for input(s): INR, PROTIME in the last 168 hours.  CBC: Recent Labs  Lab 06/04/19 0937 06/04/19 1023  WBC 15.4*  --   NEUTROABS 10.9*  --   HGB 13.8 14.6  HCT 41.4 43.0  MCV 92.6  --   PLT 168  --    Cardiac Enzymes: No results for input(s): CKTOTAL, CKMB, CKMBINDEX, TROPONINI in the last 168 hours. BNP (last 3 results) No results for input(s): PROBNP in the last 8760 hours. CBG: No results for input(s): GLUCAP in the last 168 hours. D-Dimer: No results for input(s): DDIMER in the last 72 hours. Hgb A1c: Recent Labs    06/05/19 0237  HGBA1C 6.2*   Lipid Profile: Recent Labs    06/05/19 0237  CHOL 240*  HDL 39*  LDLCALC 185*  TRIG 80  CHOLHDL 6.2   Thyroid function studies: No results for input(s): TSH, T4TOTAL, T3FREE, THYROIDAB in the  last 72 hours.  Invalid input(s): FREET3 Anemia work up: No results for input(s): VITAMINB12, FOLATE, FERRITIN, TIBC, IRON, RETICCTPCT in the last 72 hours. Sepsis Labs: Recent Labs  Lab 06/04/19 0920 06/04/19 0937 06/04/19 1300  WBC  --  15.4*  --   LATICACIDVEN 1.8  --  1.5    Microbiology Recent Results (from the past 240 hour(s))  SARS CORONAVIRUS 2 (TAT 6-24 HRS) Nasopharyngeal Nasopharyngeal Swab     Status: None   Collection Time: 06/04/19 10:04 AM   Specimen: Nasopharyngeal Swab  Result Value Ref Range Status   SARS Coronavirus 2 NEGATIVE NEGATIVE Final    Comment: (NOTE) SARS-CoV-2 target nucleic acids are NOT DETECTED. The SARS-CoV-2 RNA is generally detectable in upper and lower respiratory specimens during the acute phase of infection. Negative results do not preclude SARS-CoV-2 infection, do not rule out co-infections with other pathogens, and should not be used as the sole basis for treatment or other patient management decisions. Negative results must be combined with clinical observations, patient history, and epidemiological information. The expected result is Negative. Fact Sheet for Patients: HairSlick.no Fact Sheet for Healthcare Providers: quierodirigir.com This test is not yet approved or cleared by the [place] FDA and  has been authorized for detection and/or diagnosis of SARS-CoV-2 by FDA under an Emergency Use Authorization (EUA). This EUA will remain  in effect (meaning this test can be used) for the duration of the COVID-19 declaration under Section 56 4(b)(1) of the Act, 21 U.S.C. section 360bbb-3(b)(1), unless the authorization is terminated or revoked sooner. Performed at [facility], [address] [place], [place] [address]     Procedures and diagnostic studies:  CT Abdomen Pelvis W Contrast  Result Date: 06/04/2019 CLINICAL DATA:  Right upper quadrant abdominal pain  EXAM: CT ABDOMEN AND PELVIS WITH CONTRAST TECHNIQUE: Multidetector CT imaging of the abdomen and pelvis was performed using the standard protocol following bolus administration of intravenous contrast. CONTRAST:  OMNIPAQUE IOHEXOL 300 MG/ML  SOLN COMPARISON:  None. FINDINGS: Lower chest: No acute abnormality. Hepatobiliary: No focal liver abnormality is seen. No gallstones, gallbladder wall thickening, or biliary dilatation. Pancreas: Inflammatory changes within the pancreaticoduodenal groove. Pancreatic parenchyma enhances without evidence of pancreatic necrosis. Body and tail of the pancreas appear unremarkable. Main pancreatic duct is upper limits of normal in size. Spleen: Normal in size without focal abnormality. Adrenals/Urinary Tract: Adrenal glands are unremarkable. Kidneys are normal, without renal calculi, focal lesion, or hydronephrosis. Bladder is unremarkable. Stomach/Bowel: Small hiatal hernia. The first, second, and proximal third portions of the duodenum are circumferentially thickened. There is surrounding fat stranding in a small amount of adjacent free fluid. No abscess. No extraluminal air. No bowel obstruction. Colon is largely decompressed. No focal colonic thickening or pericolonic inflammatory changes. Vascular/Lymphatic:  Aortic atherosclerosis. Incidental note of a retroaortic left renal vein, an anatomic variant. No enlarged abdominal or pelvic lymph nodes. Reproductive: Uterus and bilateral adnexa are unremarkable. Other: Tiny fat containing umbilical hernia. Musculoskeletal: No acute or significant osseous findings. IMPRESSION: 1. Inflammatory changes adjacent to the duodenum and within the pancreaticoduodenal groove with a small volume of upper abdominal free fluid. Findings may represent acute duodenitis secondary to infection, inflammation, versus ulcer. Groove pancreatitis is also a consideration. Correlation with serum lipase is recommended. 2. Small hiatal hernia. 3. Aortic  atherosclerosis. Aortic Atherosclerosis (ICD10-I70.0). Electronically Signed   By: Davina Poke M.D.   On: 06/04/2019 11:50    Medications:    acidophilus  1 capsule Oral q morning - 10a   amitriptyline  40 mg Oral QHS   escitalopram  10 mg Oral Daily   timolol  1 drop Both Eyes BID   Continuous Infusions:  sodium chloride 75 mL/hr at 06/04/19 2051     LOS: 0 days   Geradine Girt  [facility]   How to contact the [facility] Attending or Consulting provider [place] or covering provider during after hours [place], for this patient?  1. Check the care team in [facility] and look for a) attending/consulting [facility] provider listed and b) the [facility] team listed 2. Log into [email or web address] and use [place]'s universal password to access. If you do not have the password, please contact the hospital operator. 3. Locate the [facility] provider you are looking for under [facility] and page to a number that you can be directly reached. 4. If you still have difficulty reaching the provider, please page the [facility] (Director on Call) for the Hospitalists listed on amion for assistance.  06/05/2019, 10:07 AM

## 2019-06-05 NOTE — Consult Note (Signed)
Referring Provider:  Dr. Marlin Canary Primary Care Physician:  Richmond Campbell., PA-C Primary Gastroenterologist: None (unassigned)  Reason for Consultation: Pancreatitis  HPI: Alyssa Gilbert is a 58 y.o. female admitted to the hospital yesterday with her first-ever episode of pancreatitis.  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  At the time she used to drink, she never developed pancreatitis.  The patient felt fine when she went to sleep Wednesday night after getting a flu shot earlier in the day, but the next morning (Thursday, yesterday morning) she was awakened out of sleep around 7 in the morning by the abrupt onset of severe upper abdominal pain with nausea and vomiting but no fevers or chills.  Because of the pain, she came to the emergency room where her lipase was elevated at 525, liver chemistries were completely normal, and a CT showed evidence of pancreatitis in the head of the pancreas adjacent to the duodenum without evidence of the pancreatic mass.  Subsequent work-up has included a normal triglyceride level and an ultrasound that shows no gallstones and a 6 mm common bile duct.  The patient has a history of multiple sclerosis and has been on Tysabri for a couple of years.  She has not been on prednisone since it was started.  Review of her other medications shows that she is on several medicines that can be occasionally associated with pancreatitis, but no "classic offenders."  The patient does use a couple of ibuprofen tablets every other day on average for headaches, but has no prodromal symptoms to suggest a penetrating ulcer, such as dyspepsia or antecedent abdominal pain.   Past Medical History:  Diagnosis Date  . Glaucoma   . Headache   . History of appendectomy   . MS (multiple sclerosis) (HCC)   . Neuropathy   . Pancreatitis 05/2019    Past Surgical History:  Procedure Laterality Date   . APPENDECTOMY  1980    Prior to Admission medications   Medication Sig Start Date End Date Taking? Authorizing Provider  ALPRAZolam Prudy Feeler) 0.5 MG tablet Take 0.5 mg by mouth at bedtime as needed for sleep (migraines). as directed 06/28/14  Yes [provider]  amitriptyline (ELAVIL) 10 MG tablet Take 40 mg by mouth at bedtime. 02/16/19  Yes [provider]  Docusate Calcium (STOOL SOFTENER PO) Take 100 mg by mouth daily as needed (constipation).    Yes [provider]  escitalopram (LEXAPRO) 10 MG tablet Take 10 mg by mouth daily. 12/21/14  Yes [provider]  ibuprofen (ADVIL,MOTRIN) 200 MG tablet Take 200 mg by mouth every 6 (six) hours as needed for mild pain or moderate pain.   Yes [provider]  Lactobacillus (PROBIOTIC ACIDOPHILUS PO) Take 1 tablet by mouth daily.    Yes [provider]  natalizumab (TYSABRI) 300 MG/15ML injection Inject 300 mg into the vein every 30 (thirty) days. 10/06/18  Yes [provider]  PROAIR HFA 108 (90 BASE) MCG/ACT inhaler Inhale 1-2 puffs into the lungs every 4 (four) hours as needed for wheezing or shortness of breath.  12/17/14  Yes [provider]  timolol (TIMOPTIC) 0.5 % ophthalmic solution Place 1 drop into both eyes 2 (two) times daily. 12/21/14  Yes [provider]  VITAMIN D, CHOLECALCIFEROL, PO Take 5,000 Units by mouth daily.    Yes [provider]  DULoxetine (CYMBALTA) 60 MG capsule Take 1 capsule (60  mg total) by mouth daily. Patient not taking: Reported on 01/10/2015 07/22/14   Asa LenteSater, Richard A, MD  gabapentin (NEURONTIN) 300 MG capsule 1 capsule qhs for 3 days then increase to bid for 3 days then increase to 1 capsule three times a day Patient not taking: Reported on 07/22/2014 12/29/13   Ramond MarrowSumner, Peter J, DO  interferon beta-1a (AVONEX) 30 MCG injection Inject 30 mcg into the muscle every 7 (seven) days. Patient not taking: Reported on 06/04/2019 07/22/14   Asa LenteSater,  Richard A, MD  naproxen (NAPROSYN) 500 MG tablet Take 1 tablet (500 mg total) by mouth 2 (two) times daily with a meal. Patient not taking: Reported on 06/04/2019 01/10/15   Toy Cookeyocherty, Megan, MD  predniSONE (DELTASONE) 10 MG tablet Take 1 tablet (10 mg total) by mouth daily. Take 1 tab daily for 14 days, then decrease to 1/2 tab daily for 14 days and then stop Patient not taking: Reported on 07/22/2014 12/29/13   Ramond MarrowSumner, Peter J, DO    Current Facility-Administered Medications  Medication Dose Route Frequency Provider Last Rate Last Admin  . 0.9 %  sodium chloride infusion   Intravenous Continuous Joseph ArtVann, Jessica U, DO 125 mL/hr at 06/05/19 1014 Rate Change at 06/05/19 1014  . acetaminophen (TYLENOL) tablet 650 mg  650 mg Oral Q6H PRN Mikey CollegeZhang, Ping T, MD      . acidophilus (RISAQUAD) capsule 1 capsule  1 capsule Oral q morning - 10a Mikey CollegeZhang, Ping T, MD      . albuterol (PROVENTIL) (2.5 MG/3ML) 0.083% nebulizer solution 2.5 mg  2.5 mg Inhalation Q4H PRN Mikey CollegeZhang, Ping T, MD      . ALPRAZolam Prudy Feeler(XANAX) tablet 0.5 mg  0.5 mg Oral QHS PRN Mikey CollegeZhang, Ping T, MD      . amitriptyline (ELAVIL) tablet 40 mg  40 mg Oral QHS Mikey CollegeZhang, Ping T, MD      . docusate sodium (COLACE) capsule 100 mg  100 mg Oral Daily PRN Mikey CollegeZhang, Ping T, MD      . escitalopram (LEXAPRO) tablet 10 mg  10 mg Oral Daily Mikey CollegeZhang, Ping T, MD      . HYDROmorphone (DILAUDID) injection 0.5 mg  0.5 mg Intravenous Q4H PRN Mikey CollegeZhang, Ping T, MD   0.5 mg at 06/05/19 1130  . metoCLOPramide (REGLAN) injection 10 mg  10 mg Intravenous Q6H PRN Mikey CollegeZhang, Ping T, MD   10 mg at 06/04/19 2054  . ondansetron (ZOFRAN) injection 4 mg  4 mg Intravenous Q6H PRN Mikey CollegeZhang, Ping T, MD   4 mg at 06/05/19 1127  . timolol (TIMOPTIC) 0.5 % ophthalmic solution 1 drop  1 drop Both Eyes BID Mikey CollegeZhang, Ping T, MD   1 drop at 06/05/19 [id number]    Allergies as of 06/04/2019 - Review Complete 06/04/2019  Allergen Reaction Noted  . Codeine Nausea And Vomiting 12/15/2015  . Oxycodone Nausea And Vomiting  06/04/2019  . Penicillins Hives 12/25/2013    Family History  Problem Relation Age of Onset  . Breast cancer Mother   . Hypertension Mother   . Lung cancer Father   . Lung cancer Other     Social History   Socioeconomic History  . Marital status: Divorced    Spouse name: Not on file  . Number of children: 0  . Years of education: 7212  . Highest education level: Not on file  Occupational History  . Occupation: Disabled  Tobacco Use  . Smoking status: Current Every Day Smoker    Packs/day: 1.00  Types: Cigarettes  . Smokeless tobacco: Never Used  Substance and Sexual Activity  . Alcohol use: No    Comment: quit 2010  . Drug use: Yes    Types: Marijuana    Comment: 2-3 times per day  . Sexual activity: Not on file  Other Topics Concern  . Not on file  Social History Narrative   [name]    [name]    [name]    [name]    Patient has 1 child.    [name]    Social Determinants of Health   Financial Resource Strain:   . Difficulty of Paying Living Expenses: Not on file  Food Insecurity:   . Worried About Programme researcher, broadcasting/film/videounning Out of Food in the Last Year: Not on file  . Ran Out of Food in the Last Year: Not on file  Transportation Needs:   . Lack of Transportation (Medical): Not on file  . Lack of Transportation (Non-Medical): Not on file  Physical Activity:   . Days of Exercise per Week: Not on file  . Minutes of Exercise per Session: Not on file  Stress:   . Feeling of Stress : Not on file  Social Connections:   . Frequency of Communication with Friends and Family: Not on file  . Frequency of Social Gatherings with Friends and Family: Not on file  . Attends Religious Services: Not on file  . Active Member of Clubs or Organizations: Not on file  . Attends BankerClub or Organization Meetings: Not on file  . Marital Status: Not on file  Intimate Partner Violence:   . Fear of  Current or Ex-Partner: Not on file  . Emotionally Abused: Not on file  . Physically Abused: Not on file  . Sexually Abused: Not on file    Review of Systems: Frequent headaches, which have been the main manifestation of her multiple sclerosis, but these are better since being on her MS medication.  Has a history of depression but this is under good control with medication at present.  No chest pain, no breathing difficulties, no arthropathy or joint effusions, no lower extremity edema, no skin rashes, no enlarged lymph nodes, no hematochezia or altered bowel habit, no upper GI tract symptoms  Physical Exam: Vital signs in last 24 hours: Temp:  [97.5 F (36.4 C)-98.1 F (36.7 C)] 98.1 F (36.7 C) (12/18 0535) Pulse Rate:  [76-102] 92 (12/18 0535) Resp:  [15-20] 16 (12/18 0535) BP: (110-148)/(54-94) 121/70 (12/18 0535) SpO2:  [91 %-100 %] 95 % (12/18 0535)   Pleasant Caucasian female, skin consistent with smoking history, no distress whatsoever, able to lie flat in bed and also sit up without difficulty.  Skin warm and dry, radial pulse full.  No lower extremity edema.  Chest clear to auscultation, heart without murmur or arrhythmia.  Abdomen has very active bowel sounds, but there is rather diffuse abdominal tenderness, most prominent in the upper abdomen.  No rigidity, no significant guarding, no peritoneal findings.  Intake/Output from previous day: 12/17 0701 - 12/18 0700 In: 1000 [IV Piggyback:1000] Out: -  Intake/Output this shift: No intake/output data recorded.  Lab Results: Recent Labs    06/04/19 0937 06/04/19 1023  WBC 15.4*  --   HGB 13.8 14.6  HCT 41.4 43.0  PLT 168  --    BMET Recent Labs    06/04/19 0937 06/04/19 1023  NA 143 142  K  3.7 3.9  CL 113*  --   CO2 21*  --   GLUCOSE 190*  --   BUN 8  --   CREATININE 0.80  --   CALCIUM 8.0*  --    LFT Recent Labs    06/04/19 0937  PROT 5.5*  ALBUMIN 3.3*  AST 19  ALT 20  ALKPHOS 81  BILITOT 0.7    PT/INR No results for input(s): LABPROT, INR in the last 72 hours.  Studies/Results: CT Abdomen Pelvis W Contrast  Result Date: 06/04/2019 CLINICAL DATA:  Right upper quadrant abdominal pain EXAM: CT ABDOMEN AND PELVIS WITH CONTRAST TECHNIQUE: Multidetector CT imaging of the abdomen and pelvis was performed using the standard protocol following bolus administration of intravenous contrast. CONTRAST:  158mL OMNIPAQUE IOHEXOL 300 MG/ML  SOLN COMPARISON:  None. FINDINGS: Lower chest: No acute abnormality. Hepatobiliary: No focal liver abnormality is seen. No gallstones, gallbladder wall thickening, or biliary dilatation. Pancreas: Inflammatory changes within the pancreaticoduodenal groove. Pancreatic parenchyma enhances without evidence of pancreatic necrosis. Body and tail of the pancreas appear unremarkable. Main pancreatic duct is upper limits of normal in size. Spleen: Normal in size without focal abnormality. Adrenals/Urinary Tract: Adrenal glands are unremarkable. Kidneys are normal, without renal calculi, focal lesion, or hydronephrosis. Bladder is unremarkable. Stomach/Bowel: Small hiatal hernia. The first, second, and proximal third portions of the duodenum are circumferentially thickened. There is surrounding fat stranding in a small amount of adjacent free fluid. No abscess. No extraluminal air. No bowel obstruction. Colon is largely decompressed. No focal colonic thickening or pericolonic inflammatory changes. Vascular/Lymphatic: Aortic atherosclerosis. Incidental note of a retroaortic left renal vein, an anatomic variant. No enlarged abdominal or pelvic lymph nodes. Reproductive: Uterus and bilateral adnexa are unremarkable. Other: Tiny fat containing umbilical hernia. Musculoskeletal: No acute or significant osseous findings. IMPRESSION: 1. Inflammatory changes adjacent to the duodenum and within the pancreaticoduodenal groove with a small volume of upper abdominal free fluid. Findings may  represent acute duodenitis secondary to infection, inflammation, versus ulcer. Groove pancreatitis is also a consideration. Correlation with serum lipase is recommended. 2. Small hiatal hernia. 3. Aortic atherosclerosis. Aortic Atherosclerosis (ICD10-I70.0). Electronically Signed   By: Davina Poke M.D.   On: 06/04/2019 11:50   US Abdomen Limited RUQ  Result Date: 06/05/2019 CLINICAL DATA:  Pancreatitis. EXAM: ULTRASOUND ABDOMEN LIMITED RIGHT UPPER QUADRANT COMPARISON:  June 04, 2019.  November 18, 2017. FINDINGS: Gallbladder: No gallstones or wall thickening visualized. No sonographic Murphy sign noted by sonographer. Common bile duct: Diameter: 6 mm which is within normal limits. Liver: No focal lesion identified. Within normal limits in parenchymal echogenicity. Portal vein is patent on color Doppler imaging with normal direction of blood flow towards the liver. Other: Minimal perihepatic fluid is noted. Pancreatic duct measures 3 mm which is at upper limits of normal. IMPRESSION: Minimal perihepatic fluid is noted. No other definite abnormality seen in the right upper quadrant of the abdomen. Electronically Signed   By: Marijo Conception M.D.   On: 06/05/2019 11:25    Impression: Pancreatitis of unclear cause.  Given the radiographic findings, I wonder about the possibility of a penetrating ulcer (history of ibuprofen use regularly prior to admission, without PPI prophylaxis).  I also wonder whether the patient's remote alcohol exposure may have led to a predisposition to chronic pancreatitis.  Plan: 1.  Since the patient's hemoglobin seems to have gone up since admission, I wonder if she could be third spacing and volume contracted, so I will temporarily increase  the rate of her IV fluids 2.  Recheck labs tomorrow 3.  The patient is asking for sips of clear liquids, which I find encouraging.  I will allow a clear liquid diet but have encouraged patient not to consume large quantities at once. 4.   I have started IV Protonix to cover the possibility of a penetrating ulcer 5.  Depending on the patient's clinical evolution, could consider doing an MRCP to look for a common bile duct stone, and/or endoscopic evaluation to exclude a duodenal ulcer penetrating into the pancreas (doubt). 6.  At present, I do not see a clear indication for withholding any of the patient's medications that she was on prior to admission, since none of them is clearly implicated in her pancreatitis.   LOS: 0 days   Katy Fitch [place]  06/05/2019, 12:21 PM   Pager [phone] If no answer or after 5 PM call [phone]

## 2019-06-05 NOTE — Progress Notes (Signed)
MEDICATION RELATED CONSULT NOTE - INITIAL   Pharmacy Consult for potential drug causes of pancreatitis  Attempted to contact patient for a thorough medication history but patient was not available. Of the medications listed in the patients medication history, pharmacy fill records, and chart review of the last 3 months, the following medications are associated with pancreatitis:    Acetaminophen (Could not confirm use with patient) - see ref #2 & 3 Cannabis (Could not confirm use with patient but per 05/26/19 note "Abstaining in large part") - see ref #2 & 3 Duloxetine - during postmarketing surveillance pancreatitis has been reported  Escitalopram- during postmarketing surveillance pancreatitis has been reported Gabapentin - see ref #2 & 3 Ibuprofen & Naproxen - Pancreatitis was reported in less than 1% of patients treated with oral ibuprofen in controlled, clinical trials  Interferon beta (unclear when the last dose was, may be more than 3 months ago) - During postmarketing surveillance of interferon alfa 2b, pancreatitis has been reported  Prednisone - Pancreatitis is a rare complication of corticosteroids, although a number of cases have been reported. Pancreatitis appears to develop in patients receiving prolonged or high-dose steroid therapy. Although the mechanism is not clearly understood, it has been postulated that corticosteroids may alter pancreatic secretion and possibly produce obstructive acinar changes with release of pancreatic enzymes. It is not yet known whether this is due to a hypersensitivity reaction of interference by corticosteroids with the hormonal control of the pancreas resulting in alteration of pancreatic secretion viscosity  Tobacco (Could not confirm use with patient but per 05/26/19 note "reducing reliance") - see ref #4 &5  The following medications are NOT associated with pancreatitis: Alprazolam Amitriptyline  Docusate  Natalizumab   Albuterol Timolol eye  drops    References:  1. [place] Drug Database - accessed 06/05/2019 2. Hones MR, [place] Crane OM, Blaine AM et al. Drug-induced acute pancreatitis: a review. [facility] J 2015;15:45-51  3. Kaurich T. Drug-induced acute pancreatitis. Proc ([place]) [phone] 4. [place]up JS, Bess Kinds U et al. Smoking and risk of acute and chronic pancreatitis among women and men. Arch Intern Med 9983;382:505-3 9. Barreto SG. How does cigarette smoking cause acute pancreatitis? Pancreatology 7673;41:937-90  Benetta Spar, PharmD, BCPS, [facility] Clinical Pharmacist  Please check AMION for all [place] phone numbers After 10:00 PM, call [place] [phone]

## 2019-06-06 LAB — COMPREHENSIVE METABOLIC PANEL
ALT: 14 U/L (ref 0–44)
AST: 17 U/L (ref 15–41)
Albumin: 2.7 g/dL — ABNORMAL LOW (ref 3.5–5.0)
Alkaline Phosphatase: 73 U/L (ref 38–126)
Anion gap: 7 (ref 5–15)
BUN: 10 mg/dL (ref 6–20)
CO2: 22 mmol/L (ref 22–32)
Calcium: 8 mg/dL — ABNORMAL LOW (ref 8.9–10.3)
Chloride: 111 mmol/L (ref 98–111)
Creatinine, Ser: 0.69 mg/dL (ref 0.44–1.00)
GFR calc Af Amer: >60 mL/min (ref >60)
GFR calc non Af Amer: >60 mL/min (ref >60)
Glucose, Bld: 83 mg/dL (ref 70–99)
Potassium: 3.6 mmol/L (ref 3.5–5.1)
Sodium: 140 mmol/L (ref 135–145)
Total Bilirubin: 0.8 mg/dL (ref 0.3–1.2)
Total Protein: 4.6 g/dL — ABNORMAL LOW (ref 6.5–8.1)

## 2019-06-06 LAB — CBC
HCT: 34.3 % — ABNORMAL LOW (ref 36.0–46.0)
Hemoglobin: 11.7 g/dL — ABNORMAL LOW (ref 12.0–15.0)
MCH: 31.2 pg (ref 26.0–34.0)
MCHC: 34.1 g/dL (ref 30.0–36.0)
MCV: 91.5 fL (ref 80.0–100.0)
Platelets: 198 10*3/uL (ref 150–400)
RBC: 3.75 MIL/uL — ABNORMAL LOW (ref 3.87–5.11)
RDW: 13.6 % (ref 11.5–15.5)
WBC: 16.1 10*3/uL — ABNORMAL HIGH (ref 4.0–10.5)
nRBC: 0.1 % (ref 0.0–0.2)

## 2019-06-06 LAB — LIPASE, BLOOD: Lipase: 34 U/L (ref 11–51)

## 2019-06-06 MED ORDER — PROCHLORPERAZINE EDISYLATE 10 MG/2ML IJ SOLN
10.0000 mg | Freq: Once | INTRAMUSCULAR | Status: AC
  Administered 2019-06-06: 10 mg via INTRAVENOUS
  Filled 2019-06-06: qty 2

## 2019-06-06 MED ORDER — DIPHENHYDRAMINE HCL 50 MG/ML IJ SOLN
12.5000 mg | Freq: Once | INTRAMUSCULAR | Status: AC
  Administered 2019-06-06: 12.5 mg via INTRAVENOUS
  Filled 2019-06-06: qty 1

## 2019-06-06 MED ORDER — KETOROLAC TROMETHAMINE 15 MG/ML IJ SOLN
7.5000 mg | Freq: Once | INTRAMUSCULAR | Status: AC
  Administered 2019-06-06: 7.5 mg via INTRAVENOUS
  Filled 2019-06-06: qty 1

## 2019-06-06 NOTE — Plan of Care (Signed)

## 2019-06-06 NOTE — Progress Notes (Signed)
Progress Note    Alyssa Gilbert  YWV:[id number] DOB: 1961-03-12  DOA: 06/04/2019 PCP: Aletha Halim., PA-C    Brief Narrative:     Medical records reviewed and are as summarized below:  Alyssa Gilbert is an 58 y.o. female with medical history significant of MS in remission, anxiety depression, presented with new onset of epigastric pain.  Patient woke up around 5 o'clock this morning feeling nauseous, vomitted once with stomach content no bowel no blood no coffee-ground stuff.  She went back to sleep however then around 7 o'clock with severe cramping like epigastric abdominal pain 10/10, then she vomited again stomach content.  Pain became constant, and she has had more frequent nauseous and vomiting.    Assessment/Plan:   Active Problems:   Multiple sclerosis (HCC)   Depression   Chronic fatigue   Acute pancreatitis   Pancreatitis   Acute pancreatitis -lipase >500 -CT Scan: Inflammatory changes adjacent to the duodenum and within the pancreaticoduodenal groove with a small volume of upper abdominal free fluid. Findings may represent acute duodenitis secondary to infection, inflammation, versus ulcer. Groove pancreatitis is also a consideration. -lipids w/o triglyceride elevation -denies alcohol -RUQ: Minimal perihepatic fluid is noted. No other definite abnormality seen in the right upper quadrant of the abdomen. -medication reviewed -GI consult appreciated -IVF and pain control -clears- advance to fulls later today  Leukocytosis -likely reactive to acute pancreatitis - hold off antibiotics. -trend, monitor for fever  MS - in remission -no significant correlation of her MS meds with acute pancreatitis.  This was discussed with GI attending as well.  Anxiety/depression - continue SSRI.   Family Communication/Anticipated D/C date and plan/Code Status   DVT prophylaxis: scd Code Status: Full Code.  Family Communication:  Disposition Plan: pending  improvement- AM??   Medical Consultants:   GI    Subjective:  Tolerating clears-- has a headache  Objective:    Vitals:   06/05/19 0535 06/05/19 1436 06/05/19 2138 06/06/19 0419  BP: 121/70 109/66 111/65 120/72  Pulse: 92 87 88 93  Resp: 16 18 16 18   Temp: 98.1 F (36.7 C) 99.1 F (37.3 C) 99.6 F (37.6 C) 98.6 F (37 C)  TempSrc: Oral Oral Oral Oral  SpO2: 95% 96% 96% 96%  Weight:        Intake/Output Summary (Last 24 hours) at 06/06/2019 1248 Last data filed at 06/05/2019 1800 Gross per 24 hour  Intake 2357.18 ml  Output --  Net 2357.18 ml   Filed Weights   06/04/19 0923  Weight: 68 kg    Exam: In bed, NAD rrr No wheezing, no increased work of breathing No LE edema  Data Reviewed:   I have personally reviewed following labs and imaging studies:  Labs: Labs show the following:   Basic Metabolic Panel: Recent Labs  Lab 06/04/19 0937 06/04/19 1023 06/06/19 0415  NA 143 142 140  K 3.7 3.9 3.6  CL 113*  --  111  CO2 21*  --  22  GLUCOSE 190*  --  83  BUN 8  --  10  CREATININE 0.80  --  0.69  CALCIUM 8.0*  --  8.0*   GFR CrCl cannot be calculated (Unknown ideal weight.). Liver Function Tests: Recent Labs  Lab 06/04/19 0937 06/06/19 0415  AST 19 17  ALT 20 14  ALKPHOS 81 73  BILITOT 0.7 0.8  PROT 5.5* 4.6*  ALBUMIN 3.3* 2.7*   Recent Labs  Lab 06/04/19 1300 06/06/19  0415  LIPASE 525* 34   No results for input(s): AMMONIA in the last 168 hours. Coagulation profile No results for input(s): INR, PROTIME in the last 168 hours.  CBC: Recent Labs  Lab 06/04/19 0937 06/04/19 1023 06/06/19 0415  WBC 15.4*  --  16.1*  NEUTROABS 10.9*  --   --   HGB 13.8 14.6 11.7*  HCT 41.4 43.0 34.3*  MCV 92.6  --  91.5  PLT 168  --  198   Cardiac Enzymes: No results for input(s): CKTOTAL, CKMB, CKMBINDEX, TROPONINI in the last 168 hours. BNP (last 3 results) No results for input(s): PROBNP in the last 8760 hours. CBG: No results for  input(s): GLUCAP in the last 168 hours. D-Dimer: No results for input(s): DDIMER in the last 72 hours. Hgb A1c: Recent Labs    06/05/19 0237  HGBA1C 6.2*   Lipid Profile: Recent Labs    06/05/19 0237  CHOL 240*  HDL 39*  LDLCALC 185*  TRIG 80  CHOLHDL 6.2   Thyroid function studies: No results for input(s): TSH, T4TOTAL, T3FREE, THYROIDAB in the last 72 hours.  Invalid input(s): FREET3 Anemia work up: No results for input(s): VITAMINB12, FOLATE, FERRITIN, TIBC, IRON, RETICCTPCT in the last 72 hours. Sepsis Labs: Recent Labs  Lab 06/04/19 0920 06/04/19 0937 06/04/19 1300 06/06/19 0415  WBC  --  15.4*  --  16.1*  LATICACIDVEN 1.8  --  1.5  --     Microbiology Recent Results (from the past 240 hour(s))  Culture, blood (routine x 2)     Status: None (Preliminary result)   Collection Time: 06/04/19 10:00 AM   Specimen: BLOOD RIGHT HAND  Result Value Ref Range Status   Specimen Description BLOOD RIGHT HAND  Final   Special Requests   Final    BOTTLES DRAWN AEROBIC AND ANAEROBIC Blood Culture results may not be optimal due to an inadequate volume of blood received in culture bottles   Culture   Final    NO GROWTH 1 DAY Performed at [facility], [address] [place], [place] [address]    Report Status PENDING  Incomplete  Culture, blood (routine x 2)     Status: None (Preliminary result)   Collection Time: 06/04/19 10:04 AM   Specimen: BLOOD LEFT HAND  Result Value Ref Range Status   Specimen Description BLOOD LEFT HAND  Final   Special Requests   Final    BOTTLES DRAWN AEROBIC AND ANAEROBIC Blood Culture results may not be optimal due to an inadequate volume of blood received in culture bottles   Culture   Final    NO GROWTH 1 DAY Performed at [facility], [address] [place], [place] [address]    Report Status PENDING  Incomplete  SARS CORONAVIRUS 2 (TAT 6-24 HRS) Nasopharyngeal Nasopharyngeal Swab     Status: None   Collection Time:  06/04/19 10:04 AM   Specimen: Nasopharyngeal Swab  Result Value Ref Range Status   SARS Coronavirus 2 NEGATIVE NEGATIVE Final    Comment: (NOTE) SARS-CoV-2 target nucleic acids are NOT DETECTED. The SARS-CoV-2 RNA is generally detectable in upper and lower respiratory specimens during the acute phase of infection. Negative results do not preclude SARS-CoV-2 infection, do not rule out co-infections with other pathogens, and should not be used as the sole basis for treatment or other patient management decisions. Negative results must be combined with clinical observations, patient history, and epidemiological information. The expected result is Negative. Fact  Sheet for Patients: HairSlick.no Fact Sheet for Healthcare Providers: quierodirigir.com This test is not yet approved or cleared by the [place] FDA and  has been authorized for detection and/or diagnosis of SARS-CoV-2 by FDA under an Emergency Use Authorization (EUA). This EUA will remain  in effect (meaning this test can be used) for the duration of the COVID-19 declaration under Section 56 4(b)(1) of the Act, 21 U.S.C. section 360bbb-3(b)(1), unless the authorization is terminated or revoked sooner. Performed at [facility], [address] [place], [place] [address]     Procedures and diagnostic studies:  US Abdomen Limited RUQ  Result Date: 06/05/2019 CLINICAL DATA:  Pancreatitis. EXAM: ULTRASOUND ABDOMEN LIMITED RIGHT UPPER QUADRANT COMPARISON:  June 04, 2019.  November 18, 2017. FINDINGS: Gallbladder: No gallstones or wall thickening visualized. No sonographic Murphy sign noted by sonographer. Common bile duct: Diameter: 6 mm which is within normal limits. Liver: No focal lesion identified. Within normal limits in parenchymal echogenicity. Portal vein is patent on color Doppler imaging with normal direction of blood flow towards the liver. Other: Minimal  perihepatic fluid is noted. Pancreatic duct measures 3 mm which is at upper limits of normal. IMPRESSION: Minimal perihepatic fluid is noted. No other definite abnormality seen in the right upper quadrant of the abdomen. Electronically Signed   By: Lupita Raider M.D.   On: 06/05/2019 11:25    Medications:   . acidophilus  1 capsule Oral q morning - 10a  . amitriptyline  40 mg Oral QHS  . enoxaparin (LOVENOX) injection  40 mg Subcutaneous Q24H  . escitalopram  10 mg Oral Daily  . pantoprazole (PROTONIX) IV  40 mg Intravenous Q12H  . timolol  1 drop Both Eyes BID   Continuous Infusions: . sodium chloride 125 mL/hr at 06/06/19 1005     LOS: 1 day   Joseph Art  [facility]   How to contact the [facility] Attending or Consulting provider 7A - 7P or covering provider during after hours 7P -7A, for this patient?  1. Check the care team in [facility] and look for a) attending/consulting [facility] provider listed and b) the [facility] team listed 2. Log into [email or web address] and use [place]'s universal password to access. If you do not have the password, please contact the hospital operator. 3. Locate the [facility] provider you are looking for under [facility] and page to a number that you can be directly reached. 4. If you still have difficulty reaching the provider, please page the [facility] (Director on Call) for the Hospitalists listed on amion for assistance.  06/06/2019, 12:48 PM

## 2019-06-06 NOTE — Progress Notes (Signed)
Subjective: Patient states she did not sleep well last night as her IV line would keep getting occluded.  She is complaining of a headache. Abdominal pain has decreased to 5 out of 10 in intensity. Denies further episodes of nausea or vomiting. Denies passing flatus or having a bowel movement.  Objective: Vital signs in last 24 hours: Temp:  [98.6 F (37 C)-99.6 F (37.6 C)] 98.6 F (37 C) (12/19 0419) Pulse Rate:  [87-93] 93 (12/19 0419) Resp:  [16-18] 18 (12/19 0419) BP: (109-120)/(65-72) 120/72 (12/19 0419) SpO2:  [96 %] 96 % (12/19 0419) Weight change:  Last BM Date: 06/04/19  PE: Lying comfortably on bed, no signs of dehydration GENERAL: No pallor, no icterus ABDOMEN: Soft, epigastric tenderness, nondistended, hypoactive bowel sounds EXTREMITIES: No deformity  Lab Results: Results for orders placed or performed during the hospital encounter of 06/04/19 (from the past 48 hour(s))  Comprehensive metabolic panel     Status: Abnormal   Collection Time: 06/04/19  9:37 AM  Result Value Ref Range   Sodium 143 135 - 145 mmol/L   Potassium 3.7 3.5 - 5.1 mmol/L   Chloride 113 (H) 98 - 111 mmol/L   CO2 21 (L) 22 - 32 mmol/L   Glucose, Bld 190 (H) 70 - 99 mg/dL   BUN 8 6 - 20 mg/dL   Creatinine, Ser 0.80 0.44 - 1.00 mg/dL   Calcium 8.0 (L) 8.9 - 10.3 mg/dL   Total Protein 5.5 (L) 6.5 - 8.1 g/dL   Albumin 3.3 (L) 3.5 - 5.0 g/dL   AST 19 15 - 41 U/L   ALT 20 0 - 44 U/L   Alkaline Phosphatase 81 38 - 126 U/L   Total Bilirubin 0.7 0.3 - 1.2 mg/dL   GFR calc non Af Amer >60 >60 mL/min   GFR calc Af Amer >60 >60 mL/min   Anion gap 9 5 - 15    Comment: Performed at [facility], [address], [place], [place] [address]  CBC with Differential     Status: Abnormal   Collection Time: 06/04/19  9:37 AM  Result Value Ref Range   WBC 15.4 (H) 4.0 - 10.5 K/uL   RBC 4.47 3.87 - 5.11 MIL/uL   Hemoglobin 13.8 12.0 - 15.0 g/dL   HCT 41.4 36.0 - 46.0 %   MCV 92.6 80.0 - 100.0 fL    MCH 30.9 26.0 - 34.0 pg   MCHC 33.3 30.0 - 36.0 g/dL   RDW 13.4 11.5 - 15.5 %   Platelets 168 150 - 400 K/uL    Comment: REPEATED TO VERIFY   nRBC 0.3 (H) 0.0 - 0.2 %   Neutrophils Relative % 70 %   Neutro Abs 10.9 (H) 1.7 - 7.7 K/uL   Lymphocytes Relative 22 %   Lymphs Abs 3.4 0.7 - 4.0 K/uL   Monocytes Relative 3 %   Monocytes Absolute 0.5 0.1 - 1.0 K/uL   Eosinophils Relative 4 %   Eosinophils Absolute 0.6 (H) 0.0 - 0.5 K/uL   Basophils Relative 0 %   Basophils Absolute 0.1 0.0 - 0.1 K/uL   Immature Granulocytes 1 %   Abs Immature Granulocytes 0.09 (H) 0.00 - 0.07 K/uL    Comment: Performed at [place] [address] [place], [place] [address]  HIV Antibody (routine testing w rflx)     Status: None   Collection Time: 06/04/19  9:37 AM  Result Value Ref Range   HIV Screen 4th Generation  wRfx NON REACTIVE NON REACTIVE    Comment: Performed at [facility], [address] [place], [place] [address]  Culture, blood (routine x 2)     Status: None (Preliminary result)   Collection Time: 06/04/19 10:00 AM   Specimen: BLOOD RIGHT HAND  Result Value Ref Range   Specimen Description BLOOD RIGHT HAND    Special Requests      BOTTLES DRAWN AEROBIC AND ANAEROBIC Blood Culture results may not be optimal due to an inadequate volume of blood received in culture bottles   Culture      NO GROWTH 1 DAY Performed at [facility], [address] [place], [place] [address]    Report Status PENDING   Culture, blood (routine x 2)     Status: None (Preliminary result)   Collection Time: 06/04/19 10:04 AM   Specimen: BLOOD LEFT HAND  Result Value Ref Range   Specimen Description BLOOD LEFT HAND    Special Requests      BOTTLES DRAWN AEROBIC AND ANAEROBIC Blood Culture results may not be optimal due to an inadequate volume of blood received in culture bottles   Culture      NO GROWTH 1 DAY Performed at [facility], [address] [place], [place] [address]     Report Status PENDING   SARS CORONAVIRUS 2 (TAT 6-24 HRS) Nasopharyngeal Nasopharyngeal Swab     Status: None   Collection Time: 06/04/19 10:04 AM   Specimen: Nasopharyngeal Swab  Result Value Ref Range   SARS Coronavirus 2 NEGATIVE NEGATIVE    Comment: (NOTE) SARS-CoV-2 target nucleic acids are NOT DETECTED. The SARS-CoV-2 RNA is generally detectable in upper and lower respiratory specimens during the acute phase of infection. Negative results do not preclude SARS-CoV-2 infection, do not rule out co-infections with other pathogens, and should not be used as the sole basis for treatment or other patient management decisions. Negative results must be combined with clinical observations, patient history, and epidemiological information. The expected result is Negative. Fact Sheet for Patients: HairSlick.nohttps://www.fda.gov/media/138098/download Fact Sheet for Healthcare Providers: quierodirigir.comhttps://www.fda.gov/media/138095/download This test is not yet approved or cleared by the [place] FDA and  has been authorized for detection and/or diagnosis of SARS-CoV-2 by FDA under an Emergency Use Authorization (EUA). This EUA will remain  in effect (meaning this test can be used) for the duration of the COVID-19 declaration under Section 56 4(b)(1) of the Act, 21 U.S.C. section 360bbb-3(b)(1), unless the authorization is terminated or revoked sooner. Performed at [facility], [address] [place], [place] [address]   POCT I-Stat EG7     Status: Abnormal   Collection Time: 06/04/19 10:23 AM  Result Value Ref Range   pH, Ven 7.412 7.250 - 7.430   pCO2, Ven 40.7 (L) 44.0 - 60.0 mmHg   pO2, Ven 137.0 (H) 32.0 - 45.0 mmHg   Bicarbonate 25.9 20.0 - 28.0 mmol/L   TCO2 27 22 - 32 mmol/L   O2 Saturation 99.0 %   Acid-Base Excess 1.0 0.0 - 2.0 mmol/L   Sodium 142 135 - 145 mmol/L   Potassium 3.9 3.5 - 5.1 mmol/L   Calcium, Ion 1.08 (L) 1.15 - 1.40 mmol/L   HCT 43.0 36.0 - 46.0 %   Hemoglobin  14.6 12.0 - 15.0 g/dL   Patient temperature HIDE    Sample type VENOUS   Lactic acid, plasma     Status: None   Collection Time: 06/04/19  1:00 PM  Result Value Ref Range   Lactic Acid, Venous 1.5 0.5 - 1.9 mmol/L    Comment: Performed at [facility], [address] [place], [place] [address]  Lipase     Status: Abnormal   Collection Time: 06/04/19  1:00 PM  Result Value Ref Range   Lipase 525 (H) 11 - 51 U/L    Comment: RESULTS CONFIRMED BY MANUAL DILUTION Performed at [facility], [address] [place], [place] [address]   Lipid panel     Status: Abnormal   Collection Time: 06/05/19  2:37 AM  Result Value Ref Range   Cholesterol 240 (H) 0 - 200 mg/dL   Triglycerides 80 <102<150 mg/dL   HDL 39 (L) >72>40 mg/dL   Total CHOL/HDL Ratio 6.2 RATIO   VLDL 16 0 - 40 mg/dL   LDL Cholesterol 536185 (H) 0 - 99 mg/dL    Comment:        Total Cholesterol/HDL:CHD Risk Coronary Heart Disease Risk Table                     Men   Women  1/2 Average Risk   3.4   3.3  Average Risk       5.0   4.4  2 X Average Risk   9.6   7.1  3 X Average Risk  23.4   11.0        Use the calculated Patient Ratio above and the CHD Risk Table to determine the patient's CHD Risk.        ATP III CLASSIFICATION (LDL):  <100     mg/dL   Optimal  [phone]  mg/dL   Near or Above                    Optimal  130-159  mg/dL   Borderline  [phone]  mg/dL   High  >638>190     mg/dL   Very High Performed at [facility], [address] [place], [place] [address]   Hemoglobin A1c     Status: Abnormal   Collection Time: 06/05/19  2:37 AM  Result Value Ref Range   Hgb A1c MFr Bld 6.2 (H) 4.8 - 5.6 %    Comment: (NOTE) Pre diabetes:          5.7%-6.4% Diabetes:              >6.4% Glycemic control for   <7.0% adults with diabetes    Mean Plasma Glucose 131.24 mg/dL    Comment: Performed at [facility], [address] [place], [place] [address]  CBC     Status: Abnormal   Collection Time:  06/06/19  4:15 AM  Result Value Ref Range   WBC 16.1 (H) 4.0 - 10.5 K/uL   RBC 3.75 (L) 3.87 - 5.11 MIL/uL   Hemoglobin 11.7 (L) 12.0 - 15.0 g/dL   HCT 88.434.3 (L) 16.636.0 - 06.346.0 %   MCV 91.5 80.0 - 100.0 fL   MCH 31.2 26.0 - 34.0 pg   MCHC 34.1 30.0 - 36.0 g/dL   RDW 01.613.6 01.011.5 - 93.215.5 %   Platelets 198 150 - 400 K/uL   nRBC 0.1 0.0 - 0.2 %    Comment: Performed at [facility], [address] [place], [place] [address]  Lipase in a.m.     Status: None   Collection Time: 06/06/19  4:15 AM  Result Value Ref Range  Lipase 34 11 - 51 U/L    Comment: Performed at [facility], [address] [place], [place] [address]  CMET in a.m.     Status: Abnormal   Collection Time: 06/06/19  4:15 AM  Result Value Ref Range   Sodium 140 135 - 145 mmol/L   Potassium 3.6 3.5 - 5.1 mmol/L   Chloride 111 98 - 111 mmol/L   CO2 22 22 - 32 mmol/L   Glucose, Bld 83 70 - 99 mg/dL   BUN 10 6 - 20 mg/dL   Creatinine, Ser 5.09 0.44 - 1.00 mg/dL   Calcium 8.0 (L) 8.9 - 10.3 mg/dL   Total Protein 4.6 (L) 6.5 - 8.1 g/dL   Albumin 2.7 (L) 3.5 - 5.0 g/dL   AST 17 15 - 41 U/L   ALT 14 0 - 44 U/L   Alkaline Phosphatase 73 38 - 126 U/L   Total Bilirubin 0.8 0.3 - 1.2 mg/dL   GFR calc non Af Amer >60 >60 mL/min   GFR calc Af Amer >60 >60 mL/min   Anion gap 7 5 - 15    Comment: Performed at [facility], [address], [place], [place] [address]    Studies/Results: CT Abdomen Pelvis W Contrast  Result Date: 06/04/2019 CLINICAL DATA:  Right upper quadrant abdominal pain EXAM: CT ABDOMEN AND PELVIS WITH CONTRAST TECHNIQUE: Multidetector CT imaging of the abdomen and pelvis was performed using the standard protocol following bolus administration of intravenous contrast. CONTRAST:  OMNIPAQUE IOHEXOL 300 MG/ML  SOLN COMPARISON:  None. FINDINGS: Lower chest: No acute abnormality. Hepatobiliary: No focal liver abnormality is seen. No gallstones, gallbladder wall thickening, or biliary  dilatation. Pancreas: Inflammatory changes within the pancreaticoduodenal groove. Pancreatic parenchyma enhances without evidence of pancreatic necrosis. Body and tail of the pancreas appear unremarkable. Main pancreatic duct is upper limits of normal in size. Spleen: Normal in size without focal abnormality. Adrenals/Urinary Tract: Adrenal glands are unremarkable. Kidneys are normal, without renal calculi, focal lesion, or hydronephrosis. Bladder is unremarkable. Stomach/Bowel: Small hiatal hernia. The first, second, and proximal third portions of the duodenum are circumferentially thickened. There is surrounding fat stranding in a small amount of adjacent free fluid. No abscess. No extraluminal air. No bowel obstruction. Colon is largely decompressed. No focal colonic thickening or pericolonic inflammatory changes. Vascular/Lymphatic: Aortic atherosclerosis. Incidental note of a retroaortic left renal vein, an anatomic variant. No enlarged abdominal or pelvic lymph nodes. Reproductive: Uterus and bilateral adnexa are unremarkable. Other: Tiny fat containing umbilical hernia. Musculoskeletal: No acute or significant osseous findings. IMPRESSION: 1. Inflammatory changes adjacent to the duodenum and within the pancreaticoduodenal groove with a small volume of upper abdominal free fluid. Findings may represent acute duodenitis secondary to infection, inflammation, versus ulcer. Groove pancreatitis is also a consideration. Correlation with serum lipase is recommended. 2. Small hiatal hernia. 3. Aortic atherosclerosis. Aortic Atherosclerosis (ICD10-I70.0). Electronically Signed   By: Duanne Guess M.D.   On: 06/04/2019 11:50   US Abdomen Limited RUQ  Result Date: 06/05/2019 CLINICAL DATA:  Pancreatitis. EXAM: ULTRASOUND ABDOMEN LIMITED RIGHT UPPER QUADRANT COMPARISON:  June 04, 2019.  November 18, 2017. FINDINGS: Gallbladder: No gallstones or wall thickening visualized. No sonographic Murphy sign noted by  sonographer. Common bile duct: Diameter: 6 mm which is within normal limits. Liver: No focal lesion identified. Within normal limits in parenchymal echogenicity. Portal vein is patent on color Doppler imaging with normal direction of blood flow towards the liver. Other: Minimal perihepatic  fluid is noted. Pancreatic duct measures 3 mm which is at upper limits of normal. IMPRESSION: Minimal perihepatic fluid is noted. No other definite abnormality seen in the right upper quadrant of the abdomen. Electronically Signed   By: Lupita Raider M.D.   On: 06/05/2019 11:25    Medications: I have reviewed the patient's current medications.  Assessment: Acute pancreatitis of unclear etiology-no history of alcohol use, no gallstones, normal LFTs and normal CBD, normal triglycerides   Plan: Clear liquid diet today morning, advance to full liquid diet today evening if tolerated. Continue IV fluids at 125 mL/h. Recommend early aggressive ambulation. We will send for IgG subclasses and ANA for possible autoimmune pancreatitis. Clinically recovering well. Continue PPI IV twice daily for now.  Kerin Salen, MD 06/06/2019, 9:23 AM

## 2019-06-07 ENCOUNTER — Inpatient Hospital Stay (HOSPITAL_COMMUNITY): Payer: Medicare Other

## 2019-06-07 DIAGNOSIS — K8500 Idiopathic acute pancreatitis without necrosis or infection: Principal | ICD-10-CM

## 2019-06-07 LAB — BASIC METABOLIC PANEL
Anion gap: 9 (ref 5–15)
BUN: 11 mg/dL (ref 6–20)
CO2: 18 mmol/L — ABNORMAL LOW (ref 22–32)
Calcium: 8 mg/dL — ABNORMAL LOW (ref 8.9–10.3)
Chloride: 111 mmol/L (ref 98–111)
Creatinine, Ser: 0.7 mg/dL (ref 0.44–1.00)
GFR calc Af Amer: >60 mL/min (ref >60)
GFR calc non Af Amer: >60 mL/min (ref >60)
Glucose, Bld: 72 mg/dL (ref 70–99)
Potassium: 3.4 mmol/L — ABNORMAL LOW (ref 3.5–5.1)
Sodium: 138 mmol/L (ref 135–145)

## 2019-06-07 LAB — IGG: IgG (Immunoglobin G), Serum: 456 mg/dL — ABNORMAL LOW (ref 586–1602)

## 2019-06-07 LAB — CBC
HCT: 31.2 % — ABNORMAL LOW (ref 36.0–46.0)
Hemoglobin: 10.8 g/dL — ABNORMAL LOW (ref 12.0–15.0)
MCH: 31.2 pg (ref 26.0–34.0)
MCHC: 34.6 g/dL (ref 30.0–36.0)
MCV: 90.2 fL (ref 80.0–100.0)
Platelets: 188 10*3/uL (ref 150–400)
RBC: 3.46 MIL/uL — ABNORMAL LOW (ref 3.87–5.11)
RDW: 13.4 % (ref 11.5–15.5)
WBC: 14.7 10*3/uL — ABNORMAL HIGH (ref 4.0–10.5)
nRBC: 0.3 % — ABNORMAL HIGH (ref 0.0–0.2)

## 2019-06-07 MED ORDER — LACTATED RINGERS IV SOLN: INTRAVENOUS | Status: DC

## 2019-06-07 MED ORDER — POTASSIUM CHLORIDE CRYS ER 20 MEQ PO TBCR
40.0000 meq | EXTENDED_RELEASE_TABLET | Freq: Once | ORAL | Status: AC
  Administered 2019-06-07: 40 meq via ORAL
  Filled 2019-06-07: qty 2

## 2019-06-07 MED ORDER — PANTOPRAZOLE SODIUM 40 MG PO TBEC
40.0000 mg | DELAYED_RELEASE_TABLET | Freq: Two times a day (BID) | ORAL | Status: DC
  Administered 2019-06-07 – 2019-06-08 (×3): 40 mg via ORAL
  Filled 2019-06-07 (×3): qty 1

## 2019-06-07 NOTE — Progress Notes (Signed)
Subjective: Patient was seen at bedside chair.  She continues to have 5-6 out of 10 intensity epigastric pain.  She denies nausea or vomiting but has not been able to advance her diet beyond clear liquids.  She is passing minimal amount of flatus and has not had a bowel movement in over 3 days.l  Objective: Vital signs in last 24 hours: Temp:  [97.6 F (36.4 C)-98.4 F (36.9 C)] 97.6 F (36.4 C) (12/20 0610) Pulse Rate:  [79-99] 99 (12/20 0610) Resp:  [16-18] 18 (12/20 0610) BP: (113-133)/(68-75) 133/68 (12/20 0610) SpO2:  [91 %-97 %] 91 % (12/20 0610) Weight change:  Last BM Date: 06/03/19  PE: No pallor, no icterus GENERAL: Moist mucous membranes ABDOMEN: Soft, epigastric and upper abdominal tenderness, hypoactive bowel sounds, no rigidity, no guarding EXTREMITIES: No deformity  Lab Results: Results for orders placed or performed during the hospital encounter of 06/04/19 (from the past 48 hour(s))  CBC     Status: Abnormal   Collection Time: 06/06/19  4:15 AM  Result Value Ref Range   WBC 16.1 (H) 4.0 - 10.5 K/uL   RBC 3.75 (L) 3.87 - 5.11 MIL/uL   Hemoglobin 11.7 (L) 12.0 - 15.0 g/dL   HCT 34.3 (L) 36.0 - 46.0 %   MCV 91.5 80.0 - 100.0 fL   MCH 31.2 26.0 - 34.0 pg   MCHC 34.1 30.0 - 36.0 g/dL   RDW 13.6 11.5 - 15.5 %   Platelets 198 150 - 400 K/uL   nRBC 0.1 0.0 - 0.2 %    Comment: Performed at [facility], [place] [address] [place], [place] [address]  Lipase in a.m.     Status: None   Collection Time: 06/06/19  4:15 AM  Result Value Ref Range   Lipase 34 11 - 51 U/L    Comment: Performed at [facility] [address] [place], [place] [address]  CMET in a.m.     Status: Abnormal   Collection Time: 06/06/19  4:15 AM  Result Value Ref Range   Sodium 140 135 - 145 mmol/L   Potassium 3.6 3.5 - 5.1 mmol/L   Chloride 111 98 - 111 mmol/L   CO2 22 22 - 32 mmol/L   Glucose, Bld 83 70 - 99 mg/dL   BUN 10 6 - 20 mg/dL   Creatinine, Ser 0.69 0.44 - 1.00 mg/dL    Calcium 8.0 (L) 8.9 - 10.3 mg/dL   Total Protein 4.6 (L) 6.5 - 8.1 g/dL   Albumin 2.7 (L) 3.5 - 5.0 g/dL   AST 17 15 - 41 U/L   ALT 14 0 - 44 U/L   Alkaline Phosphatase 73 38 - 126 U/L   Total Bilirubin 0.8 0.3 - 1.2 mg/dL   GFR calc non Af Amer >60 >60 mL/min   GFR calc Af Amer >60 >60 mL/min   Anion gap 7 5 - 15    Comment: Performed at [facility], [address] [place], [place] [address]  CBC     Status: Abnormal   Collection Time: 06/07/19  3:19 AM  Result Value Ref Range   WBC 14.7 (H) 4.0 - 10.5 K/uL   RBC 3.46 (L) 3.87 - 5.11 MIL/uL   Hemoglobin 10.8 (L) 12.0 - 15.0 g/dL   HCT 31.2 (L) 36.0 - 46.0 %   MCV 90.2 80.0 - 100.0 fL   MCH 31.2 26.0 - 34.0 pg   MCHC 34.6 30.0 - 36.0 g/dL  RDW 13.4 11.5 - 15.5 %   Platelets 188 150 - 400 K/uL   nRBC 0.3 (H) 0.0 - 0.2 %    Comment: Performed at [facility], [address], [place], [place] [address]  Basic metabolic panel     Status: Abnormal   Collection Time: 06/07/19  3:19 AM  Result Value Ref Range   Sodium 138 135 - 145 mmol/L   Potassium 3.4 (L) 3.5 - 5.1 mmol/L   Chloride 111 98 - 111 mmol/L   CO2 18 (L) 22 - 32 mmol/L   Glucose, Bld 72 70 - 99 mg/dL   BUN 11 6 - 20 mg/dL   Creatinine, Ser 4.81 0.44 - 1.00 mg/dL   Calcium 8.0 (L) 8.9 - 10.3 mg/dL   GFR calc non Af Amer >60 >60 mL/min   GFR calc Af Amer >60 >60 mL/min   Anion gap 9 5 - 15    Comment: Performed at [facility], [address] [place], [place] [address]    Studies/Results: US Abdomen Limited RUQ  Result Date: 06/05/2019 CLINICAL DATA:  Pancreatitis. EXAM: ULTRASOUND ABDOMEN LIMITED RIGHT UPPER QUADRANT COMPARISON:  June 04, 2019.  November 18, 2017. FINDINGS: Gallbladder: No gallstones or wall thickening visualized. No sonographic Murphy sign noted by sonographer. Common bile duct: Diameter: 6 mm which is within normal limits. Liver: No focal lesion identified. Within normal limits in parenchymal echogenicity. Portal vein is  patent on color Doppler imaging with normal direction of blood flow towards the liver. Other: Minimal perihepatic fluid is noted. Pancreatic duct measures 3 mm which is at upper limits of normal. IMPRESSION: Minimal perihepatic fluid is noted. No other definite abnormality seen in the right upper quadrant of the abdomen. Electronically Signed   By: Lupita Raider M.D.   On: 06/05/2019 11:25    Medications: I have reviewed the patient's current medications.  Assessment: Pancreatitis of unknown etiology(no history of alcohol use, no gallstones, normal CBD, normal LFTs, normal triglycerides, ANA/IgG subclasses pending) On IV fluids at 125 mL/h, Hb trending down with a normal BUN signifying adequate hydration  Plan: Will obtain abdominal x-ray to rule out ileus. Patient wants to continue clear liquid. Recommend early aggressive ambulation. Will decrease IV fluids to 100 cc an hour.  Kerin Salen, MD 06/07/2019, 9:46 AM

## 2019-06-07 NOTE — Progress Notes (Signed)
Progress Note    Alyssa Gilbert  WGY:[id number] DOB: 03-Feb-1961  DOA: 06/04/2019 PCP: Richmond Campbell., PA-C    Brief Narrative:     Medical records reviewed and are as summarized below:  Alyssa Gilbert is an 58 y.o. female with medical history significant of MS in remission, anxiety depression, presented with new onset of epigastric pain.  Patient woke up around 5 o'clock this morning feeling nauseous, vomitted once with stomach content no bowel no blood no coffee-ground stuff.  She went back to sleep however then around 7 o'clock with severe cramping like epigastric abdominal pain 10/10, then she vomited again stomach content.  Pain became constant, and she has had more frequent nauseous and vomiting.    Assessment/Plan:   Active Problems:   Multiple sclerosis (HCC)   Depression   Chronic fatigue   Acute pancreatitis   Pancreatitis   Acute pancreatitis -lipase >500 -CT Scan: Inflammatory changes adjacent to the duodenum and within the pancreaticoduodenal groove with a small volume of upper abdominal free fluid. Findings may represent acute duodenitis secondary to infection, inflammation, versus ulcer. Groove pancreatitis is also a consideration. -lipids w/o triglyceride elevation -denies alcohol -RUQ: Minimal perihepatic fluid is noted. No other definite abnormality seen in the right upper quadrant of the abdomen. -medication reviewed -GI consult appreciated -IVF and pain control -Backed off to clears once again.  Does not want to advance diet.  Not comfortable going home.  Leukocytosis -likely reactive to acute pancreatitis.  Afebrile.  No indication of antibiotics.  Improving.  Repeat labs in the morning.  Continue IV fluids.  MS - in remission -no significant correlation of her MS meds with acute pancreatitis.  This was discussed with GI attending as well.  Anxiety/depression - continue SSRI.   Family Communication/Anticipated D/C date and plan/Code  Status   DVT prophylaxis: scd Code Status: Full Code.  Family Communication: None present. Disposition Plan: pending improvement-possible discharge in AM??   Medical Consultants:   GI    Subjective:  Seen and examined.  She tells me that she started having some abdominal pain and abdominal distention after she ate full liquid.  Has been de-escalate it to clears once again by GI.  Abdominal x-ray is unremarkable.  Objective:    Vitals:   06/06/19 0419 06/06/19 1400 06/06/19 2132 06/07/19 0610  BP: 120/72 114/74 113/75 133/68  Pulse: 93 79 89 99  Resp: 18 16 18 18   Temp: 98.6 F (37 C) 98.4 F (36.9 C) 98.3 F (36.8 C) 97.6 F (36.4 C)  TempSrc: Oral Oral Oral Oral  SpO2: 96% 96% 97% 91%  Weight:       No intake or output data in the 24 hours ending 06/07/19 1214 Filed Weights   06/04/19 0923  Weight: 68 kg    Exam: General exam: Appears calm and comfortable  Respiratory system: Clear to auscultation. Respiratory effort normal. Cardiovascular system: S1 & S2 heard, RRR. No JVD, murmurs, rubs, gallops or clicks. No pedal edema. Gastrointestinal system: Abdomen is mildly distended, soft and generalized tender. No organomegaly or masses felt. Normal bowel sounds heard. Central nervous system: Alert and oriented. No focal neurological deficits. Extremities: Symmetric 5 x 5 power. Skin: No rashes, lesions or ulcers.  Psychiatry: Judgement and insight appear normal. Mood & affect appropriate.    Data Reviewed:   I have personally reviewed following labs and imaging studies:  Labs: Labs show the following:   Basic Metabolic Panel: Recent Labs  Lab 06/04/19  3267 06/04/19 1023 06/06/19 0415 06/07/19 0319  NA 143 142 140 138  K 3.7 3.9 3.6 3.4*  CL 113*  --  111 111  CO2 21*  --  22 18*  GLUCOSE 190*  --  83 72  BUN 8  --  10 11  CREATININE 0.80  --  0.69 0.70  CALCIUM 8.0*  --  8.0* 8.0*   GFR CrCl cannot be calculated (Unknown ideal weight.). Liver  Function Tests: Recent Labs  Lab 06/04/19 0937 06/06/19 0415  AST 19 17  ALT 20 14  ALKPHOS 81 73  BILITOT 0.7 0.8  PROT 5.5* 4.6*  ALBUMIN 3.3* 2.7*   Recent Labs  Lab 06/04/19 1300 06/06/19 0415  LIPASE 525* 34   No results for input(s): AMMONIA in the last 168 hours. Coagulation profile No results for input(s): INR, PROTIME in the last 168 hours.  CBC: Recent Labs  Lab 06/04/19 0937 06/04/19 1023 06/06/19 0415 06/07/19 0319  WBC 15.4*  --  16.1* 14.7*  NEUTROABS 10.9*  --   --   --   HGB 13.8 14.6 11.7* 10.8*  HCT 41.4 43.0 34.3* 31.2*  MCV 92.6  --  91.5 90.2  PLT 168  --  198 188   Cardiac Enzymes: No results for input(s): CKTOTAL, CKMB, CKMBINDEX, TROPONINI in the last 168 hours. BNP (last 3 results) No results for input(s): PROBNP in the last 8760 hours. CBG: No results for input(s): GLUCAP in the last 168 hours. D-Dimer: No results for input(s): DDIMER in the last 72 hours. Hgb A1c: Recent Labs    06/05/19 0237  HGBA1C 6.2*   Lipid Profile: Recent Labs    06/05/19 0237  CHOL 240*  HDL 39*  LDLCALC 185*  TRIG 80  CHOLHDL 6.2   Thyroid function studies: No results for input(s): TSH, T4TOTAL, T3FREE, THYROIDAB in the last 72 hours.  Invalid input(s): FREET3 Anemia work up: No results for input(s): VITAMINB12, FOLATE, FERRITIN, TIBC, IRON, RETICCTPCT in the last 72 hours. Sepsis Labs: Recent Labs  Lab 06/04/19 0920 06/04/19 0937 06/04/19 1300 06/06/19 0415 06/07/19 0319  WBC  --  15.4*  --  16.1* 14.7*  LATICACIDVEN 1.8  --  1.5  --   --     Microbiology Recent Results (from the past 240 hour(s))  Culture, blood (routine x 2)     Status: None (Preliminary result)   Collection Time: 06/04/19 10:00 AM   Specimen: BLOOD RIGHT HAND  Result Value Ref Range Status   Specimen Description BLOOD RIGHT HAND  Final   Special Requests   Final    BOTTLES DRAWN AEROBIC AND ANAEROBIC Blood Culture results may not be optimal due to an  inadequate volume of blood received in culture bottles   Culture   Final    NO GROWTH 3 DAYS Performed at [facility], [place] [address], [place], [place] [address]    Report Status PENDING  Incomplete  Culture, blood (routine x 2)     Status: None (Preliminary result)   Collection Time: 06/04/19 10:04 AM   Specimen: BLOOD LEFT HAND  Result Value Ref Range Status   Specimen Description BLOOD LEFT HAND  Final   Special Requests   Final    BOTTLES DRAWN AEROBIC AND ANAEROBIC Blood Culture results may not be optimal due to an inadequate volume of blood received in culture bottles   Culture   Final    NO GROWTH 3 DAYS Performed at [facility],  [address], [place], [place] [address]    Report Status PENDING  Incomplete  SARS CORONAVIRUS 2 (TAT 6-24 HRS) Nasopharyngeal Nasopharyngeal Swab     Status: None   Collection Time: 06/04/19 10:04 AM   Specimen: Nasopharyngeal Swab  Result Value Ref Range Status   SARS Coronavirus 2 NEGATIVE NEGATIVE Final    Comment: (NOTE) SARS-CoV-2 target nucleic acids are NOT DETECTED. The SARS-CoV-2 RNA is generally detectable in upper and lower respiratory specimens during the acute phase of infection. Negative results do not preclude SARS-CoV-2 infection, do not rule out co-infections with other pathogens, and should not be used as the sole basis for treatment or other patient management decisions. Negative results must be combined with clinical observations, patient history, and epidemiological information. The expected result is Negative. Fact Sheet for Patients: HairSlick.no Fact Sheet for Healthcare Providers: quierodirigir.com This test is not yet approved or cleared by the [place] FDA and  has been authorized for detection and/or diagnosis of SARS-CoV-2 by FDA under an Emergency Use Authorization (EUA). This EUA will remain  in effect (meaning this test can be used) for  the duration of the COVID-19 declaration under Section 56 4(b)(1) of the Act, 21 U.S.C. section 360bbb-3(b)(1), unless the authorization is terminated or revoked sooner. Performed at [facility], [address] [place], [place] [address]     Procedures and diagnostic studies:  DG Abd 1 View  Result Date: 06/07/2019 CLINICAL DATA:  Abdominal pain.  Evaluate for ileus. EXAM: ABDOMEN - 1 VIEW COMPARISON:  CT abdomen pelvis dated June 04, 2019. FINDINGS: The bowel gas pattern is normal. No radio-opaque calculi or other significant radiographic abnormality are seen. IMPRESSION: Negative. Electronically Signed   By: Obie Dredge M.D.   On: 06/07/2019 10:22    Medications:   . acidophilus  1 capsule Oral q morning - 10a  . amitriptyline  40 mg Oral QHS  . enoxaparin (LOVENOX) injection  40 mg Subcutaneous Q24H  . escitalopram  10 mg Oral Daily  . pantoprazole  40 mg Oral BID  . potassium chloride  40 mEq Oral Once  . timolol  1 drop Both Eyes BID   Continuous Infusions: . lactated ringers 100 mL/hr at 06/07/19 1006     LOS: 2 days   Hughie Closs , MD [facility]   How to contact the [facility] Attending or Consulting provider 7A - 7P or covering provider during after hours 7P -7A, for this patient?  1. Check the care team in [facility] and look for a) attending/consulting [facility] provider listed and b) the [facility] team listed 2. Log into [email or web address] and use [place]'s universal password to access. If you do not have the password, please contact the hospital operator. 3. Locate the [facility] provider you are looking for under [facility] and page to a number that you can be directly reached. 4. If you still have difficulty reaching the provider, please page the [facility] (Director on Call) for the Hospitalists listed on amion for assistance.  06/07/2019, 12:14 PM

## 2019-06-08 LAB — CBC WITH DIFFERENTIAL/PLATELET
Abs Immature Granulocytes: 0.12 10*3/uL — ABNORMAL HIGH (ref 0.00–0.07)
Basophils Absolute: 0 10*3/uL (ref 0.0–0.1)
Basophils Relative: 0 %
Eosinophils Absolute: 0.3 10*3/uL (ref 0.0–0.5)
Eosinophils Relative: 2 %
HCT: 32.3 % — ABNORMAL LOW (ref 36.0–46.0)
Hemoglobin: 11.4 g/dL — ABNORMAL LOW (ref 12.0–15.0)
Immature Granulocytes: 1 %
Lymphocytes Relative: 17 %
Lymphs Abs: 2.3 10*3/uL (ref 0.7–4.0)
MCH: 30.9 pg (ref 26.0–34.0)
MCHC: 35.3 g/dL (ref 30.0–36.0)
MCV: 87.5 fL (ref 80.0–100.0)
Monocytes Absolute: 1.2 10*3/uL — ABNORMAL HIGH (ref 0.1–1.0)
Monocytes Relative: 9 %
Neutro Abs: 9.8 10*3/uL — ABNORMAL HIGH (ref 1.7–7.7)
Neutrophils Relative %: 71 %
Platelets: 233 10*3/uL (ref 150–400)
RBC: 3.69 MIL/uL — ABNORMAL LOW (ref 3.87–5.11)
RDW: 13.2 % (ref 11.5–15.5)
WBC: 13.8 10*3/uL — ABNORMAL HIGH (ref 4.0–10.5)
nRBC: 0.5 % — ABNORMAL HIGH (ref 0.0–0.2)

## 2019-06-08 LAB — COMPREHENSIVE METABOLIC PANEL
ALT: 16 U/L (ref 0–44)
AST: 17 U/L (ref 15–41)
Albumin: 2.6 g/dL — ABNORMAL LOW (ref 3.5–5.0)
Alkaline Phosphatase: 71 U/L (ref 38–126)
Anion gap: 12 (ref 5–15)
BUN: 6 mg/dL (ref 6–20)
CO2: 19 mmol/L — ABNORMAL LOW (ref 22–32)
Calcium: 8.1 mg/dL — ABNORMAL LOW (ref 8.9–10.3)
Chloride: 107 mmol/L (ref 98–111)
Creatinine, Ser: 0.7 mg/dL (ref 0.44–1.00)
GFR calc Af Amer: >60 mL/min (ref >60)
GFR calc non Af Amer: >60 mL/min (ref >60)
Glucose, Bld: 74 mg/dL (ref 70–99)
Potassium: 3.1 mmol/L — ABNORMAL LOW (ref 3.5–5.1)
Sodium: 138 mmol/L (ref 135–145)
Total Bilirubin: 1.4 mg/dL — ABNORMAL HIGH (ref 0.3–1.2)
Total Protein: 5 g/dL — ABNORMAL LOW (ref 6.5–8.1)

## 2019-06-08 NOTE — Discharge Summary (Signed)
Physician Discharge Summary  Alyssa Gilbert GEZ:[id number] DOB: 1960/07/20 DOA: 06/04/2019  PCP: Aletha Halim., PA-C  Admit date: 06/04/2019 Discharge date: 06/08/2019  Admitted From: Home Disposition: Home  Recommendations for Outpatient Follow-up:  1. Follow up with PCP in 1-2 weeks 2. Follow with GI in 2 weeks 3. Please obtain BMP/CBC in one week 4. Please follow up on the following pending results:  Home Health: None Equipment/Devices: None  Discharge Condition: Stable CODE STATUS: Full code Diet recommendation: Regular  Subjective: Seen and examined.  Feels much better with very minimal abdominal tenderness.  Tolerated soft diet.  Agreeable to go home.  Brief/Interim Summary: Alyssa Gilbert is an 58 y.o. female with medical history significant ofMS in remission,anxiety depression,presented with new onset of epigastric pain, nausea and vomiting.She was diagnosed with acute pancreatitis based on elevated lipase and CT abdomen changes.  She was managed conservatively.  Kept n.p.o. initially and then once her pain was improved, she was started on clears and advance to soft diet which she has been tolerating now.  GI was also consulted.  Reviewing her medication history, she is on prednisone and in the phone which may be the culprit of her acute pancreatitis making it either drug-induced pancreatitis or idiopathic pancreatitis.  Patient needs to discuss with her physicians about these 2 medications to see if there are any other alternatives.  Once again, her pancreatitis is either drug-induced or idiopathic and those diagnoses are clinical with no solid evidence leaning towards one or the other.  She has been cleared by GI and she will see them in 2 weeks.  I have discontinued her NSAID from her home med list.  Discharge Diagnoses:  Active Problems:   Multiple sclerosis (HCC)   Depression   Chronic fatigue   Acute pancreatitis   Pancreatitis    Discharge  Instructions  Discharge Instructions    Discharge patient   Complete by: As directed    Discharge disposition: 01-Home or Self Care   Discharge patient date: 06/08/2019     Allergies as of 06/08/2019      Reactions   Codeine Nausea And Vomiting   Oxycodone Nausea And Vomiting   Penicillins Hives   Did it involve swelling of the face/tongue/throat, SOB, or low BP? No Did it involve sudden or severe rash/hives, skin peeling, or any reaction on the inside of your mouth or nose? No Did you need to seek medical attention at a hospital or doctor's office? No When did it last happen?young child If all above answers are "NO", may proceed with cephalosporin use.      Medication List    STOP taking these medications   ibuprofen 200 MG tablet Commonly known as: ADVIL     TAKE these medications   ALPRAZolam 0.5 MG tablet Commonly known as: XANAX Take 0.5 mg by mouth at bedtime as needed for sleep (migraines). as directed   amitriptyline 10 MG tablet Commonly known as: ELAVIL Take 40 mg by mouth at bedtime.   DULoxetine 60 MG capsule Commonly known as: CYMBALTA Take 1 capsule (60 mg total) by mouth daily.   escitalopram 10 MG tablet Commonly known as: LEXAPRO Take 10 mg by mouth daily.   gabapentin 300 MG capsule Commonly known as: Neurontin 1 capsule qhs for 3 days then increase to bid for 3 days then increase to 1 capsule three times a day   interferon beta-1a 30 MCG injection Commonly known as: Avonex Inject 30 mcg into the muscle every  7 (seven) days.   naproxen 500 MG tablet Commonly known as: NAPROSYN Take 1 tablet (500 mg total) by mouth 2 (two) times daily with a meal.   predniSONE 10 MG tablet Commonly known as: DELTASONE Take 1 tablet (10 mg total) by mouth daily. Take 1 tab daily for 14 days, then decrease to 1/2 tab daily for 14 days and then stop   ProAir HFA 108 (90 Base) MCG/ACT inhaler Generic drug: albuterol Inhale 1-2 puffs into the lungs  every 4 (four) hours as needed for wheezing or shortness of breath.   PROBIOTIC ACIDOPHILUS PO Take 1 tablet by mouth daily.   STOOL SOFTENER PO Take 100 mg by mouth daily as needed (constipation).   timolol 0.5 % ophthalmic solution Commonly known as: TIMOPTIC Place 1 drop into both eyes 2 (two) times daily.   Tysabri 300 MG/15ML injection Generic drug: natalizumab Inject 300 mg into the vein every 30 (thirty) days.   VITAMIN D (CHOLECALCIFEROL) PO Take 5,000 Units by mouth daily.      Follow-up Information    Richmond CampbellKaplan, Kristen W., PA-C Follow up in 1 week(s).   Specialty: Family Medicine Contact information: [address] [place] [address] [phone]          Allergies  Allergen Reactions  . Codeine Nausea And Vomiting  . Oxycodone Nausea And Vomiting  . Penicillins Hives    Did it involve swelling of the face/tongue/throat, SOB, or low BP? No Did it involve sudden or severe rash/hives, skin peeling, or any reaction on the inside of your mouth or nose? No Did you need to seek medical attention at a hospital or doctor's office? No When did it last happen?young child If all above answers are "NO", may proceed with cephalosporin use.    Consultations: GI   Procedures/Studies: DG Abd 1 View  Result Date: 06/07/2019 CLINICAL DATA:  Abdominal pain.  Evaluate for ileus. EXAM: ABDOMEN - 1 VIEW COMPARISON:  CT abdomen pelvis dated June 04, 2019. FINDINGS: The bowel gas pattern is normal. No radio-opaque calculi or other significant radiographic abnormality are seen. IMPRESSION: Negative. Electronically Signed   By: Obie DredgeWilliam T Derry M.D.   On: 06/07/2019 10:22   CT Abdomen Pelvis W Contrast  Result Date: 06/04/2019 CLINICAL DATA:  Right upper quadrant abdominal pain EXAM: CT ABDOMEN AND PELVIS WITH CONTRAST TECHNIQUE: Multidetector CT imaging of the abdomen and pelvis was performed using the standard protocol following bolus administration of  intravenous contrast. CONTRAST:  100mL OMNIPAQUE IOHEXOL 300 MG/ML  SOLN COMPARISON:  None. FINDINGS: Lower chest: No acute abnormality. Hepatobiliary: No focal liver abnormality is seen. No gallstones, gallbladder wall thickening, or biliary dilatation. Pancreas: Inflammatory changes within the pancreaticoduodenal groove. Pancreatic parenchyma enhances without evidence of pancreatic necrosis. Body and tail of the pancreas appear unremarkable. Main pancreatic duct is upper limits of normal in size. Spleen: Normal in size without focal abnormality. Adrenals/Urinary Tract: Adrenal glands are unremarkable. Kidneys are normal, without renal calculi, focal lesion, or hydronephrosis. Bladder is unremarkable. Stomach/Bowel: Small hiatal hernia. The first, second, and proximal third portions of the duodenum are circumferentially thickened. There is surrounding fat stranding in a small amount of adjacent free fluid. No abscess. No extraluminal air. No bowel obstruction. Colon is largely decompressed. No focal colonic thickening or pericolonic inflammatory changes. Vascular/Lymphatic: Aortic atherosclerosis. Incidental note of a retroaortic left renal vein, an anatomic variant. No enlarged abdominal or pelvic lymph nodes. Reproductive: Uterus and bilateral adnexa are unremarkable. Other: Tiny fat containing umbilical hernia.  Musculoskeletal: No acute or significant osseous findings. IMPRESSION: 1. Inflammatory changes adjacent to the duodenum and within the pancreaticoduodenal groove with a small volume of upper abdominal free fluid. Findings may represent acute duodenitis secondary to infection, inflammation, versus ulcer. Groove pancreatitis is also a consideration. Correlation with serum lipase is recommended. 2. Small hiatal hernia. 3. Aortic atherosclerosis. Aortic Atherosclerosis (ICD10-I70.0). Electronically Signed   By: Duanne GuessNicholas  Plundo M.D.   On: 06/04/2019 11:50   US Abdomen Limited RUQ  Result Date:  06/05/2019 CLINICAL DATA:  Pancreatitis. EXAM: ULTRASOUND ABDOMEN LIMITED RIGHT UPPER QUADRANT COMPARISON:  June 04, 2019.  November 18, 2017. FINDINGS: Gallbladder: No gallstones or wall thickening visualized. No sonographic Murphy sign noted by sonographer. Common bile duct: Diameter: 6 mm which is within normal limits. Liver: No focal lesion identified. Within normal limits in parenchymal echogenicity. Portal vein is patent on color Doppler imaging with normal direction of blood flow towards the liver. Other: Minimal perihepatic fluid is noted. Pancreatic duct measures 3 mm which is at upper limits of normal. IMPRESSION: Minimal perihepatic fluid is noted. No other definite abnormality seen in the right upper quadrant of the abdomen. Electronically Signed   By: Lupita RaiderJames  Green Jr M.D.   On: 06/05/2019 11:25      Discharge Exam: Vitals:   06/08/19 0549 06/08/19 1248  BP: 135/78 138/78  Pulse: (!) 103 84  Resp: 18 20  Temp: 99.9 F (37.7 C) 97.8 F (36.6 C)  SpO2: 94% 98%   Vitals:   06/07/19 1502 06/07/19 2045 06/08/19 0549 06/08/19 1248  BP: 130/80 138/84 135/78 138/78  Pulse: 91 83 (!) 103 84  Resp: 19 18 18 20   Temp: 98 F (36.7 C) 98.3 F (36.8 C) 99.9 F (37.7 C) 97.8 F (36.6 C)  TempSrc: Oral Oral Oral Oral  SpO2: 99% 98% 94% 98%  Weight:        General: Pt is alert, awake, not in acute distress Cardiovascular: RRR, S1/S2 +, no rubs, no gallops Respiratory: CTA bilaterally, no wheezing, no rhonchi Abdominal: Soft, minimal epigastric tenderness, ND, bowel sounds + Extremities: no edema, no cyanosis    The results of significant diagnostics from this hospitalization (including imaging, microbiology, ancillary and laboratory) are listed below for reference.     Microbiology: Recent Results (from the past 240 hour(s))  Culture, blood (routine x 2)     Status: None (Preliminary result)   Collection Time: 06/04/19 10:00 AM   Specimen: BLOOD RIGHT HAND  Result Value Ref  Range Status   Specimen Description BLOOD RIGHT HAND  Final   Special Requests   Final    BOTTLES DRAWN AEROBIC AND ANAEROBIC Blood Culture results may not be optimal due to an inadequate volume of blood received in culture bottles   Culture   Final    NO GROWTH 4 DAYS Performed at [facility], [address] [place], [place] [address]    Report Status PENDING  Incomplete  Culture, blood (routine x 2)     Status: None (Preliminary result)   Collection Time: 06/04/19 10:04 AM   Specimen: BLOOD LEFT HAND  Result Value Ref Range Status   Specimen Description BLOOD LEFT HAND  Final   Special Requests   Final    BOTTLES DRAWN AEROBIC AND ANAEROBIC Blood Culture results may not be optimal due to an inadequate volume of blood received in culture bottles   Culture   Final    NO GROWTH 4 DAYS Performed at [facility], [address], [place], [place] [address]    Report Status PENDING  Incomplete  SARS CORONAVIRUS 2 (TAT 6-24 HRS) Nasopharyngeal Nasopharyngeal Swab     Status: None   Collection Time: 06/04/19 10:04 AM   Specimen: Nasopharyngeal Swab  Result Value Ref Range Status   SARS Coronavirus 2 NEGATIVE NEGATIVE Final    Comment: (NOTE) SARS-CoV-2 target nucleic acids are NOT DETECTED. The SARS-CoV-2 RNA is generally detectable in upper and lower respiratory specimens during the acute phase of infection. Negative results do not preclude SARS-CoV-2 infection, do not rule out co-infections with other pathogens, and should not be used as the sole basis for treatment or other patient management decisions. Negative results must be combined with clinical observations, patient history, and epidemiological information. The expected result is Negative. Fact Sheet for Patients: HairSlick.no Fact Sheet for Healthcare Providers: quierodirigir.com This test is not yet approved or cleared by the [place] FDA and  has  been authorized for detection and/or diagnosis of SARS-CoV-2 by FDA under an Emergency Use Authorization (EUA). This EUA will remain  in effect (meaning this test can be used) for the duration of the COVID-19 declaration under Section 56 4(b)(1) of the Act, 21 U.S.C. section 360bbb-3(b)(1), unless the authorization is terminated or revoked sooner. Performed at [facility], [address] [place], [place] [address]      Labs: BNP (last 3 results) No results for input(s): BNP in the last 8760 hours. Basic Metabolic Panel: Recent Labs  Lab 06/04/19 0937 06/04/19 1023 06/06/19 0415 06/07/19 0319 06/08/19 0831  NA 143 142 140 138 138  K 3.7 3.9 3.6 3.4* 3.1*  CL 113*  --  111 111 107  CO2 21*  --  22 18* 19*  GLUCOSE 190*  --  83 72 74  BUN 8  --  10 11 6   CREATININE 0.80  --  0.69 0.70 0.70  CALCIUM 8.0*  --  8.0* 8.0* 8.1*   Liver Function Tests: Recent Labs  Lab 06/04/19 0937 06/06/19 0415 06/08/19 0831  AST 19 17 17   ALT 20 14 16   ALKPHOS 81 73 71  BILITOT 0.7 0.8 1.4*  PROT 5.5* 4.6* 5.0*  ALBUMIN 3.3* 2.7* 2.6*   Recent Labs  Lab 06/04/19 1300 06/06/19 0415  LIPASE 525* 34   No results for input(s): AMMONIA in the last 168 hours. CBC: Recent Labs  Lab 06/04/19 0937 06/04/19 1023 06/06/19 0415 06/07/19 0319 06/08/19 0831  WBC 15.4*  --  16.1* 14.7* 13.8*  NEUTROABS 10.9*  --   --   --  9.8*  HGB 13.8 14.6 11.7* 10.8* 11.4*  HCT 41.4 43.0 34.3* 31.2* 32.3*  MCV 92.6  --  91.5 90.2 87.5  PLT 168  --  198 188 233   Cardiac Enzymes: No results for input(s): CKTOTAL, CKMB, CKMBINDEX, TROPONINI in the last 168 hours. BNP: Invalid input(s): POCBNP CBG: No results for input(s): GLUCAP in the last 168 hours. D-Dimer No results for input(s): DDIMER in the last 72 hours. Hgb A1c No results for input(s): HGBA1C in the last 72 hours. Lipid Profile No results for input(s): CHOL, HDL, LDLCALC, TRIG, CHOLHDL, LDLDIRECT in the last 72  hours. Thyroid function studies No results for input(s): TSH, T4TOTAL, T3FREE, THYROIDAB in the last 72 hours.  Invalid input(s): FREET3 Anemia work up No results for input(s): VITAMINB12, FOLATE, FERRITIN, TIBC, IRON, RETICCTPCT in the last 72 hours. Urinalysis No results found for: COLORURINE, APPEARANCEUR, LABSPEC, PHURINE, GLUCOSEU, HGBUR,  BILIRUBINUR, KETONESUR, PROTEINUR, UROBILINOGEN, NITRITE, LEUKOCYTESUR Sepsis Labs Invalid input(s): PROCALCITONIN,  WBC,  LACTICIDVEN Microbiology Recent Results (from the past 240 hour(s))  Culture, blood (routine x 2)     Status: None (Preliminary result)   Collection Time: 06/04/19 10:00 AM   Specimen: BLOOD RIGHT HAND  Result Value Ref Range Status   Specimen Description BLOOD RIGHT HAND  Final   Special Requests   Final    BOTTLES DRAWN AEROBIC AND ANAEROBIC Blood Culture results may not be optimal due to an inadequate volume of blood received in culture bottles   Culture   Final    NO GROWTH 4 DAYS Performed at [facility], [address], [place], [place] [address]    Report Status PENDING  Incomplete  Culture, blood (routine x 2)     Status: None (Preliminary result)   Collection Time: 06/04/19 10:04 AM   Specimen: BLOOD LEFT HAND  Result Value Ref Range Status   Specimen Description BLOOD LEFT HAND  Final   Special Requests   Final    BOTTLES DRAWN AEROBIC AND ANAEROBIC Blood Culture results may not be optimal due to an inadequate volume of blood received in culture bottles   Culture   Final    NO GROWTH 4 DAYS Performed at [facility], [address], [place], [place] [address]    Report Status PENDING  Incomplete  SARS CORONAVIRUS 2 (TAT 6-24 HRS) Nasopharyngeal Nasopharyngeal Swab     Status: None   Collection Time: 06/04/19 10:04 AM   Specimen: Nasopharyngeal Swab  Result Value Ref Range Status   SARS Coronavirus 2 NEGATIVE NEGATIVE Final    Comment: (NOTE) SARS-CoV-2 target nucleic acids are NOT  DETECTED. The SARS-CoV-2 RNA is generally detectable in upper and lower respiratory specimens during the acute phase of infection. Negative results do not preclude SARS-CoV-2 infection, do not rule out co-infections with other pathogens, and should not be used as the sole basis for treatment or other patient management decisions. Negative results must be combined with clinical observations, patient history, and epidemiological information. The expected result is Negative. Fact Sheet for Patients: HairSlick.no Fact Sheet for Healthcare Providers: quierodirigir.com This test is not yet approved or cleared by the [place] FDA and  has been authorized for detection and/or diagnosis of SARS-CoV-2 by FDA under an Emergency Use Authorization (EUA). This EUA will remain  in effect (meaning this test can be used) for the duration of the COVID-19 declaration under Section 56 4(b)(1) of the Act, 21 U.S.C. section 360bbb-3(b)(1), unless the authorization is terminated or revoked sooner. Performed at [facility], [address] [place], [place] [address]      Time coordinating discharge: Over 30 minutes  SIGNED:   Hughie Closs, MD  [facility] 06/08/2019, 1:04 PM  If 7PM-7AM, please contact night-coverage [email or web address] Password [email or web address]

## 2019-06-08 NOTE — Progress Notes (Signed)
Alyssa Gilbert 11:08 AM  Subjective: Patient seen and examined in hospital computer chart reviewed and case discussed with my partner Dr. Therisa Doyne and she is doing better and they are slowly advancing her diet and she has not had this problem before and it came on suddenly and she has no new complaints  Objective: Vital signs stable afebrile no acute distress abdomen is soft nontender white count decreased other labs stable  Assessment: Pancreatitis questionable etiology  Plan: Care with aspirin and nonsteroidals at home recommend Tylenol only follow-up with either Dr. Cristina Gong or Therisa Doyne in a week or 2 and call me back sooner if any question or problem with this hospital stay  [facility]  office [phone] After 5PM or if no answer call [phone]

## 2019-06-08 NOTE — Discharge Instructions (Signed)
Acute Pancreatitis ° °Acute pancreatitis happens when the pancreas gets swollen. The pancreas is a large gland in the body that helps to control blood sugar. It also makes enzymes that help to digest food. °This condition can last a few days and cause serious problems. The lungs, heart, and kidneys may stop working. °What are the causes? °Causes include: °· Alcohol abuse. °· Drug abuse. °· Gallstones. °· A tumor in the pancreas. °Other causes include: °· Some medicines. °· Some chemicals. °· Diabetes. °· An infection. °· Damage caused by an accident. °· The poison (venom) from a scorpion bite. °· Belly (abdominal) surgery. °· The body's defense system (immune system) attacking the pancreas (autoimmune pancreatitis). °· Genes that are passed from parent to child (inherited). °In some cases, the cause is not known. °What are the signs or symptoms? °· Pain in the upper belly that may be felt in the back. The pain may be very bad. °· Swelling of the belly. °· Feeling sick to your stomach (nauseous) and throwing up (vomiting). °· Fever. °How is this treated? °You will likely have to stay in the hospital. Treatment may include: °· Pain medicine. °· Fluid through an IV tube. °· Placing a tube in the stomach to take out the stomach contents. This may help you stop throwing up. °· Not eating for 3-4 days. °· Antibiotic medicines, if you have an infection. °· Treating any other problems that may be the cause. °· Steroid medicines, if your problem is caused by your defense system attacking your body's own tissues. °· Surgery. °Follow these instructions at home: °Eating and drinking ° °· Follow instructions from your doctor about what to eat and drink. °· Eat foods that do not have a lot of fat in them. °· Eat small meals often. Do not eat big meals. °· Drink enough fluid to keep your pee (urine) pale yellow. °· Do not drink alcohol if it caused your condition. °Medicines °· Take over-the-counter and prescription medicines only  as told by your doctor. °· Ask your doctor if the medicine prescribed to you: °? Requires you to avoid driving or using heavy machinery. °? Can cause trouble pooping (constipation). You may need to take steps to prevent or treat trouble pooping: °§ Take over-the-counter or prescription medicines. °§ Eat foods that are high in fiber. These include beans, whole grains, and fresh fruits and vegetables. °§ Limit foods that are high in fat and sugar. These include fried or sweet foods. °General instructions °· Do not use any products that contain nicotine or tobacco, such as cigarettes, e-cigarettes, and chewing tobacco. If you need help quitting, ask your doctor. °· Get plenty of rest. °· Check your blood sugar at home as told by your doctor. °· Keep all follow-up visits as told by your doctor. This is important. °Contact a doctor if: °· You do not get better as quickly as expected. °· You have new symptoms. °· Your symptoms get worse. °· You have pain or weakness that lasts a long time. °· You keep feeling sick to your stomach. °· You get better and then you have pain again. °· You have a fever. °Get help right away if: °· You cannot eat or keep fluids down. °· Your pain gets very bad. °· Your skin or the white part of your eyes turns yellow. °· You have sudden swelling in your belly. °· You throw up. °· You feel dizzy or you pass out (faint). °· Your blood sugar is high (over 300   mg/dL). °Summary °· Acute pancreatitis happens when the pancreas gets swollen. °· This condition is often caused by alcohol abuse, drug abuse, or gallstones. °· You will likely have to stay in the hospital for treatment. °This information is not intended to replace advice given to you by your health care provider. Make sure you discuss any questions you have with your health care provider. °Document Released: 11/21/2007 Document Revised: 03/24/2018 Document Reviewed: 03/24/2018 °Elsevier Patient Education © 2020 [employer] ° °

## 2019-06-09 LAB — CULTURE, BLOOD (ROUTINE X 2)
Culture: NO GROWTH
Culture: NO GROWTH

## 2019-06-09 LAB — ANTINUCLEAR ANTIBODIES, IFA: ANA Ab, IFA: NEGATIVE

## 2019-06-09 LAB — IGG 4: IgG, Subclass 4: 14 mg/dL (ref 2–96)

## 2019-06-17 ENCOUNTER — Other Ambulatory Visit: Payer: Self-pay | Admitting: Gastroenterology

## 2019-06-23 ENCOUNTER — Other Ambulatory Visit (HOSPITAL_COMMUNITY)
Admission: RE | Admit: 2019-06-23 | Discharge: 2019-06-23 | Disposition: A | Discharge status: Home / Self Care | Payer: Medicare Other | Source: Ambulatory Visit | Attending: Gastroenterology | Admitting: Gastroenterology

## 2019-06-23 DIAGNOSIS — Z20822 Contact with and (suspected) exposure to covid-19: Secondary | ICD-10-CM | POA: Insufficient documentation

## 2019-06-23 DIAGNOSIS — Z01812 Encounter for preprocedural laboratory examination: Secondary | ICD-10-CM | POA: Insufficient documentation

## 2019-06-24 LAB — NOVEL CORONAVIRUS, NAA (HOSP ORDER, SEND-OUT TO REF LAB; TAT 18-24 HRS): SARS-CoV-2, NAA: NOT DETECTED

## 2019-06-26 ENCOUNTER — Ambulatory Visit (HOSPITAL_COMMUNITY): Admission: RE | Admit: 2019-06-26 | Payer: Medicare Other | Source: Home / Self Care | Admitting: Gastroenterology

## 2019-06-26 ENCOUNTER — Encounter (HOSPITAL_COMMUNITY): Payer: Self-pay | Admitting: Registered Nurse

## 2019-06-26 SURGERY — ESOPHAGOGASTRODUODENOSCOPY (EGD) WITH PROPOFOL
Anesthesia: Monitor Anesthesia Care

## 2019-07-03 ENCOUNTER — Other Ambulatory Visit: Payer: Self-pay | Admitting: Gastroenterology

## 2019-07-07 ENCOUNTER — Other Ambulatory Visit: Payer: Self-pay

## 2019-07-07 ENCOUNTER — Other Ambulatory Visit (HOSPITAL_COMMUNITY)
Admission: RE | Admit: 2019-07-07 | Discharge: 2019-07-07 | Disposition: A | Discharge status: Home / Self Care | Payer: Medicare Other | Source: Ambulatory Visit | Attending: Gastroenterology | Admitting: Gastroenterology

## 2019-07-07 DIAGNOSIS — Z20822 Contact with and (suspected) exposure to covid-19: Secondary | ICD-10-CM | POA: Diagnosis not present

## 2019-07-07 DIAGNOSIS — Z01812 Encounter for preprocedural laboratory examination: Secondary | ICD-10-CM | POA: Diagnosis present

## 2019-07-07 NOTE — Progress Notes (Signed)
Pre-op endo call attempted. No answer. Lmtcb.  

## 2019-07-07 NOTE — Progress Notes (Signed)
Pre-op endo call completed  

## 2019-07-08 LAB — NOVEL CORONAVIRUS, NAA (HOSP ORDER, SEND-OUT TO REF LAB; TAT 18-24 HRS): SARS-CoV-2, NAA: NOT DETECTED

## 2019-07-10 ENCOUNTER — Encounter (HOSPITAL_COMMUNITY)
Admission: RE | Disposition: A | Discharge status: Home / Self Care | Payer: Self-pay | Source: Home / Self Care | Attending: Gastroenterology

## 2019-07-10 ENCOUNTER — Ambulatory Visit (HOSPITAL_COMMUNITY): Payer: Medicare Other | Admitting: Certified Registered Nurse Anesthetist

## 2019-07-10 ENCOUNTER — Ambulatory Visit (HOSPITAL_COMMUNITY)
Admission: RE | Admit: 2019-07-10 | Discharge: 2019-07-10 | Disposition: A | Discharge status: Home / Self Care | Payer: Medicare Other | Attending: Gastroenterology | Admitting: Gastroenterology

## 2019-07-10 ENCOUNTER — Other Ambulatory Visit: Payer: Self-pay

## 2019-07-10 ENCOUNTER — Ambulatory Visit (HOSPITAL_COMMUNITY): Admit: 2019-07-10 | Payer: Medicare Other | Admitting: Gastroenterology

## 2019-07-10 ENCOUNTER — Encounter (HOSPITAL_COMMUNITY): Payer: Self-pay

## 2019-07-10 DIAGNOSIS — R933 Abnormal findings on diagnostic imaging of other parts of digestive tract: Secondary | ICD-10-CM | POA: Diagnosis not present

## 2019-07-10 DIAGNOSIS — G35 Multiple sclerosis: Secondary | ICD-10-CM | POA: Diagnosis not present

## 2019-07-10 DIAGNOSIS — F329 Major depressive disorder, single episode, unspecified: Secondary | ICD-10-CM | POA: Diagnosis not present

## 2019-07-10 DIAGNOSIS — Z801 Family history of malignant neoplasm of trachea, bronchus and lung: Secondary | ICD-10-CM | POA: Insufficient documentation

## 2019-07-10 DIAGNOSIS — B009 Herpesviral infection, unspecified: Secondary | ICD-10-CM | POA: Insufficient documentation

## 2019-07-10 DIAGNOSIS — F17200 Nicotine dependence, unspecified, uncomplicated: Secondary | ICD-10-CM | POA: Diagnosis not present

## 2019-07-10 DIAGNOSIS — Z803 Family history of malignant neoplasm of breast: Secondary | ICD-10-CM | POA: Diagnosis not present

## 2019-07-10 DIAGNOSIS — R1013 Epigastric pain: Secondary | ICD-10-CM | POA: Insufficient documentation

## 2019-07-10 DIAGNOSIS — Z79899 Other long term (current) drug therapy: Secondary | ICD-10-CM | POA: Insufficient documentation

## 2019-07-10 DIAGNOSIS — K229 Disease of esophagus, unspecified: Secondary | ICD-10-CM | POA: Diagnosis not present

## 2019-07-10 HISTORY — PX: ESOPHAGOGASTRODUODENOSCOPY (EGD) WITH PROPOFOL: SHX5813

## 2019-07-10 HISTORY — PX: BIOPSY: SHX5522

## 2019-07-10 SURGERY — ESOPHAGOGASTRODUODENOSCOPY (EGD) WITH PROPOFOL
Anesthesia: Monitor Anesthesia Care

## 2019-07-10 MED ORDER — LACTATED RINGERS IV SOLN
INTRAVENOUS | Status: DC
  Administered 2019-07-10: 11:00:00 1000 mL via INTRAVENOUS

## 2019-07-10 MED ORDER — PROPOFOL 10 MG/ML IV BOLUS
INTRAVENOUS | Status: DC | PRN
  Administered 2019-07-10: 60 mg via INTRAVENOUS
  Administered 2019-07-10 (×2): 20 mg via INTRAVENOUS

## 2019-07-10 MED ORDER — LIDOCAINE 2% (20 MG/ML) 5 ML SYRINGE
INTRAMUSCULAR | Status: DC | PRN
  Administered 2019-07-10: 60 mg via INTRAVENOUS

## 2019-07-10 MED ORDER — SODIUM CHLORIDE 0.9 % IV SOLN: INTRAVENOUS | Status: DC

## 2019-07-10 SURGICAL SUPPLY — 14 items

## 2019-07-10 NOTE — Discharge Instructions (Signed)

## 2019-07-10 NOTE — Anesthesia Preprocedure Evaluation (Signed)
Anesthesia Evaluation  Patient identified by MRN, date of birth, ID band Patient awake    Reviewed: Allergy & Precautions, NPO status , Patient's Chart, lab work & pertinent test results  Airway Mallampati: I       Dental no notable dental hx. (+) Teeth Intact   Pulmonary Current Smoker and Patient abstained from smoking.,    Pulmonary exam normal breath sounds clear to auscultation       Cardiovascular negative cardio ROS Normal cardiovascular exam Rhythm:Regular     Neuro/Psych  Headaches, PSYCHIATRIC DISORDERS Depression    GI/Hepatic negative GI ROS, Neg liver ROS,   Endo/Other  negative endocrine ROS  Renal/GU negative Renal ROS     Musculoskeletal negative musculoskeletal ROS (+)   Abdominal Normal abdominal exam  (+)   Peds  Hematology   Anesthesia Other Findings   Reproductive/Obstetrics                             Anesthesia Physical Anesthesia Plan  ASA: II  Anesthesia Plan: MAC   Post-op Pain Management:    Induction:   PONV Risk Score and Plan: Propofol infusion  Airway Management Planned: Natural Airway, Mask and Nasal Cannula  Additional Equipment: None  Intra-op Plan:   Post-operative Plan:   Informed Consent: I have reviewed the patients History and Physical, chart, labs and discussed the procedure including the risks, benefits and alternatives for the proposed anesthesia with the patient or authorized representative who has indicated his/her understanding and acceptance.       Plan Discussed with: CRNA  Anesthesia Plan Comments:         Anesthesia Quick Evaluation

## 2019-07-10 NOTE — Transfer of Care (Signed)
Immediate Anesthesia Transfer of Care Note  Patient: Alyssa Gilbert  Procedure(s) Performed: ESOPHAGOGASTRODUODENOSCOPY (EGD) WITH PROPOFOL (N/A ) BIOPSY  Patient Location: PACU  Anesthesia Type:MAC  Level of Consciousness: sedated  Airway & Oxygen Therapy: Patient Spontanous Breathing and Patient connected to face mask oxygen  Post-op Assessment: Report given to RN and Post -op Vital signs reviewed and stable  Post vital signs: Reviewed and stable  Last Vitals:  Vitals Value Taken Time  BP    Temp    Pulse    Resp    SpO2      Last Pain:  Vitals:   07/10/19 1019  TempSrc: Oral  PainSc: 0-No pain         Complications: No apparent anesthesia complications

## 2019-07-10 NOTE — Op Note (Signed)
[facility] Patient Name: Alyssa Gilbert Procedure Date: 07/10/2019 MRN: [id number] Attending MD: Jeani Hawking , MD Date of Birth: July 26, 1960 CSN: [id number] Age: 59 Admit Type: Outpatient Procedure:                Upper GI endoscopy Indications:              Epigastric abdominal pain, Abnormal CT of the GI                            tract Providers:                Jeani Hawking, MD, Clearnce Sorrel, RN, Kandice Robinsons, Technician, Wanita Chamberlain, Technician Referring MD:              Medicines:                Propofol per Anesthesia Complications:            No immediate complications. Estimated Blood Loss:     Estimated blood loss: none. Procedure:                Pre-Anesthesia Assessment:                           - Prior to the procedure, a History and Physical                            was performed, and patient medications and                            allergies were reviewed. The patient's tolerance of                            previous anesthesia was also reviewed. The risks                            and benefits of the procedure and the sedation                            options and risks were discussed with the patient.                            All questions were answered, and informed consent                            was obtained. Prior Anticoagulants: The patient has                            taken no previous anticoagulant or antiplatelet                            agents. ASA Grade Assessment: III - A patient with  severe systemic disease. After reviewing the risks                            and benefits, the patient was deemed in                            satisfactory condition to undergo the procedure.                           - Sedation was administered by an anesthesia                            professional. Deep sedation was attained.                           After obtaining informed  consent, the endoscope was                            passed under direct vision. Throughout the                            procedure, the patient's blood pressure, pulse, and                            oxygen saturations were monitored continuously. The                            GIF-H190 ([id number] Olympus gastroscope was                            introduced through the mouth, and advanced to the                            fourth part of duodenum. The upper GI endoscopy was                            accomplished without difficulty. The patient                            tolerated the procedure well. Scope In: Scope Out: Findings:      Localized, white plaques were found in the middle third of the       esophagus. Biopsies were taken with a cold forceps for histology.      The stomach was normal.      The examined duodenum was normal.      There was no evidence of any inflammation in the duodenal lumen to       correlate with the December CT scan findings. Impression:               - Esophageal plaques were found, suspicious for                            candidiasis. Biopsied.                           -  Normal stomach.                           - Normal examined duodenum. Moderate Sedation:      Not Applicable - Patient had care per Anesthesia. Recommendation:           - Patient has a contact number available for                            emergencies. The signs and symptoms of potential                            delayed complications were discussed with the                            patient. Return to normal activities tomorrow.                            Written discharge instructions were provided to the                            patient.                           - Resume previous diet.                           - Continue present medications.                           - Await pathology results.                           - Return to GI office in 4 weeks. Procedure  Code(s):        --- Professional ---                           [phone], Esophagogastroduodenoscopy, flexible,                            transoral; with biopsy, single or multiple Diagnosis Code(s):        --- Professional ---                           K22.9, Disease of esophagus, unspecified                           R10.13, Epigastric pain                           R93.3, Abnormal findings on diagnostic imaging of                            other parts of digestive tract CPT copyright 2019 [employer]. All rights reserved. The codes documented in this report are preliminary and upon coder review may  be revised to meet current compliance requirements. Jeani Hawking, MD Jeani Hawking, MD  07/10/2019 11:14:56 AM This report has been signed electronically. Number of Addenda: 0

## 2019-07-10 NOTE — Anesthesia Postprocedure Evaluation (Signed)
Anesthesia Post Note  Patient: Alyssa Gilbert  Procedure(s) Performed: ESOPHAGOGASTRODUODENOSCOPY (EGD) WITH PROPOFOL (N/A ) BIOPSY     Patient location during evaluation: Phase II Anesthesia Type: MAC Level of consciousness: awake Pain management: pain level controlled Vital Signs Assessment: post-procedure vital signs reviewed and stable Respiratory status: spontaneous breathing Cardiovascular status: stable Postop Assessment: no apparent nausea or vomiting Anesthetic complications: no    Last Vitals:  Vitals:   07/10/19 1120 07/10/19 1130  BP: (!) 115/56 (!) 118/53  Pulse: 81 79  Resp: 15 19  Temp:    SpO2: 97% 96%    Last Pain:  Vitals:   07/10/19 1111  TempSrc: Oral  PainSc: 0-No pain   Pain Goal:                   Huston Foley

## 2019-07-13 ENCOUNTER — Encounter: Payer: Self-pay | Admitting: *Deleted

## 2019-07-13 LAB — SURGICAL PATHOLOGY

## 2020-06-08 IMAGING — CT CT ABD-PELV W/ CM
2 of 5 series · 16 of 46 positions shown, 18 images · IV contrast (APPLIED)
Comparison: None.

CLINICAL DATA: Right upper quadrant abdominal pain

EXAM:
CT ABDOMEN AND PELVIS WITH CONTRAST
TECHNIQUE: Multidetector CT imaging of the abdomen and pelvis was performed
using the standard protocol following bolus administration of
intravenous contrast.
CONTRAST:  100mL OMNIPAQUE IOHEXOL 300 MG/ML  SOLN

[Series 3: abd/ pelvis 5.0 i30f 2 · axial · 0.78mm/px · z∈[+724,+1119]mm · 13 of 89 slices shown, 15 images]
[im 5/89  soft-tissue]
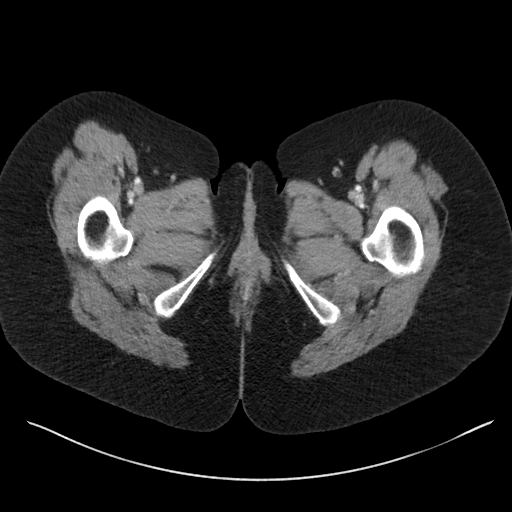
[im 5/89  bone]
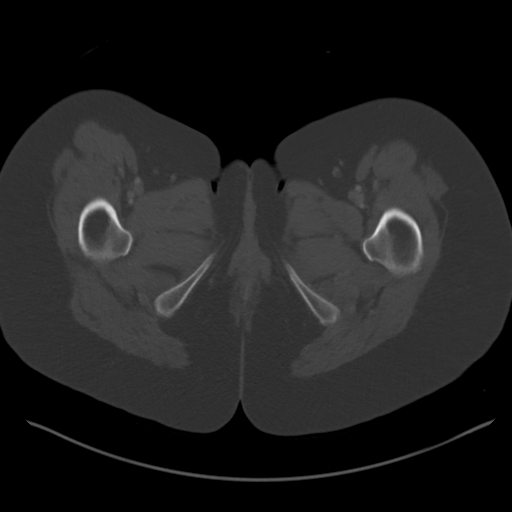
[im 14/89  soft-tissue]
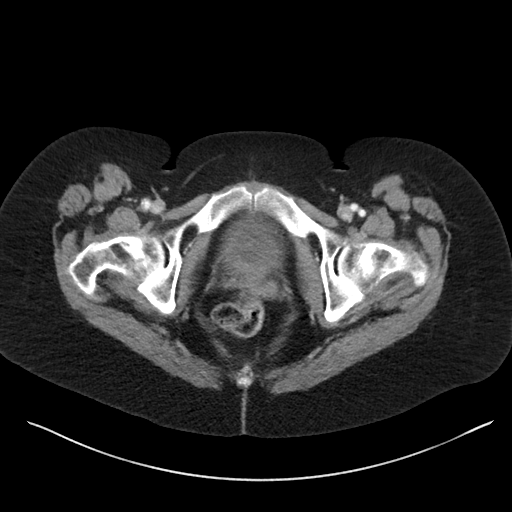
[im 18/89  soft-tissue]
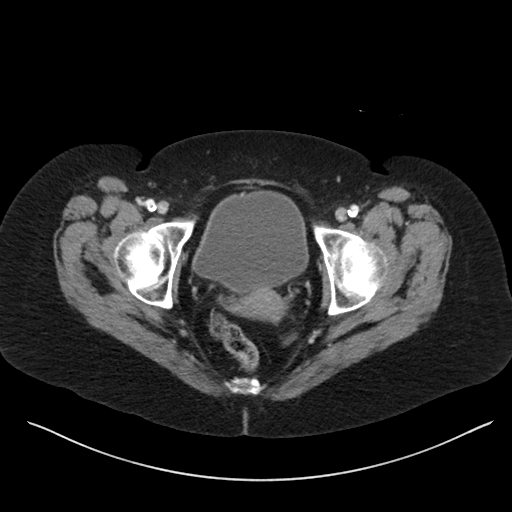
[im 27/89  soft-tissue]
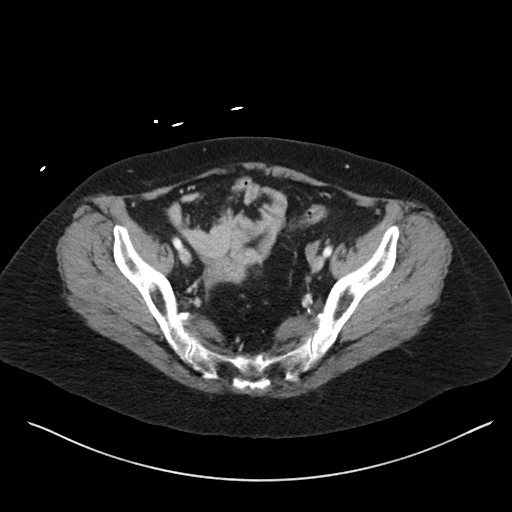
[im 31/89  soft-tissue]
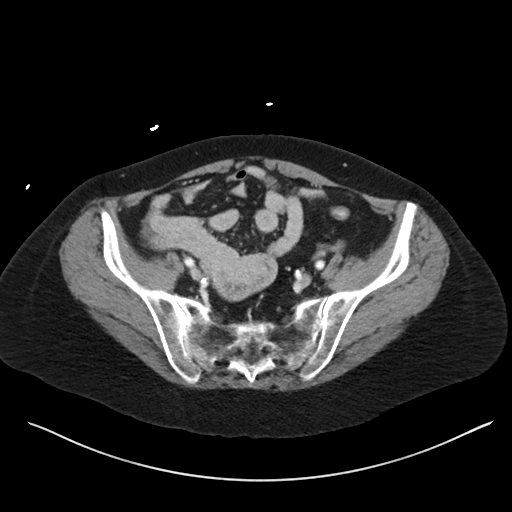
[im 40/89  soft-tissue]
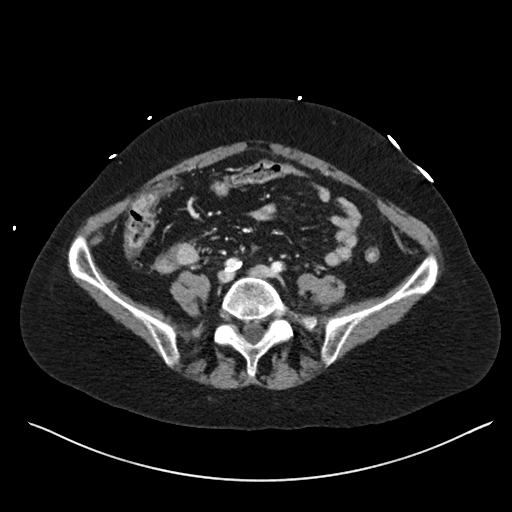
[im 45/89  soft-tissue]
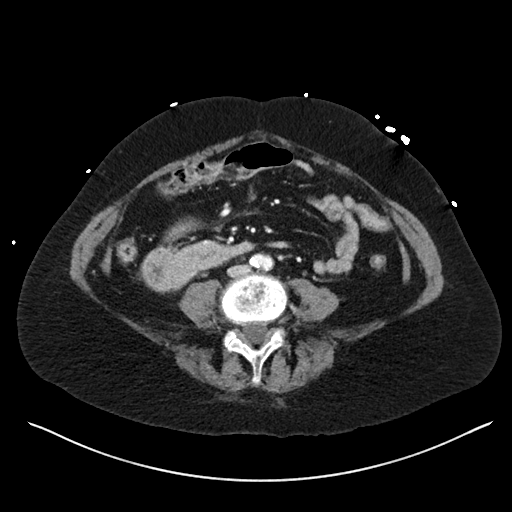
[im 49/89  soft-tissue]
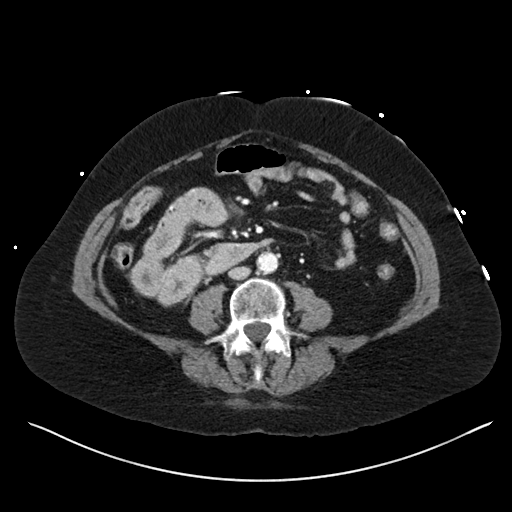
[im 58/89  soft-tissue]
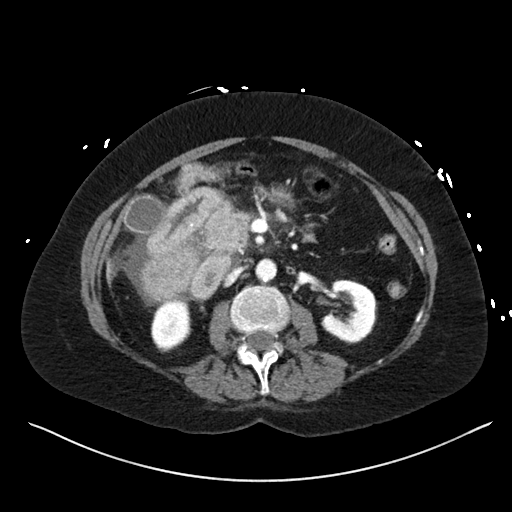
[im 58/89  bone]
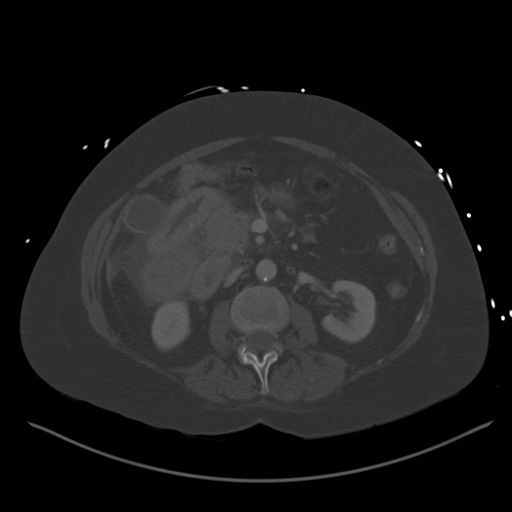
[im 62/89  soft-tissue]
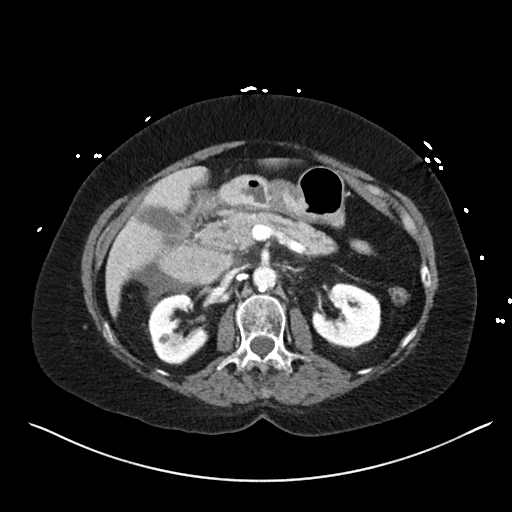
[im 71/89  soft-tissue]
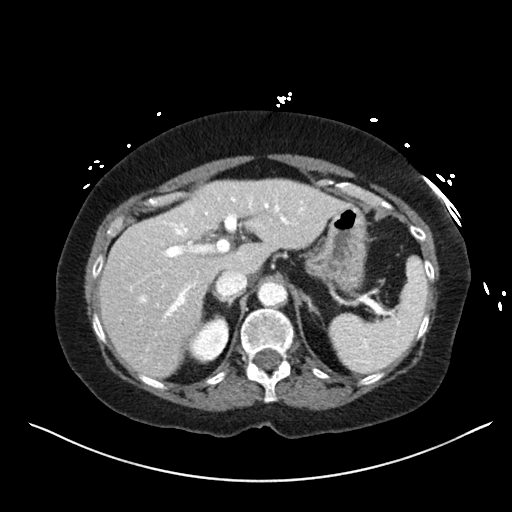
[im 75/89  soft-tissue]
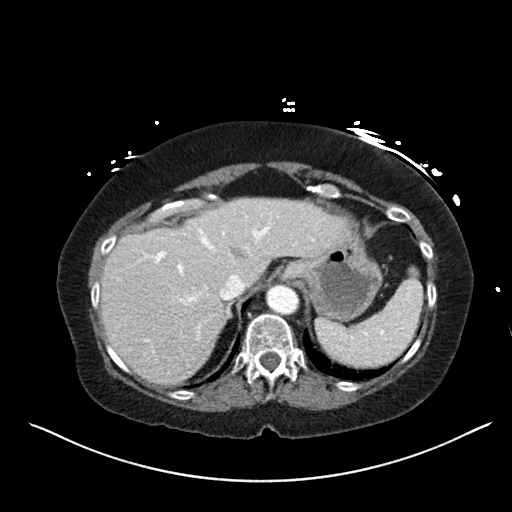
[im 84/89  soft-tissue]
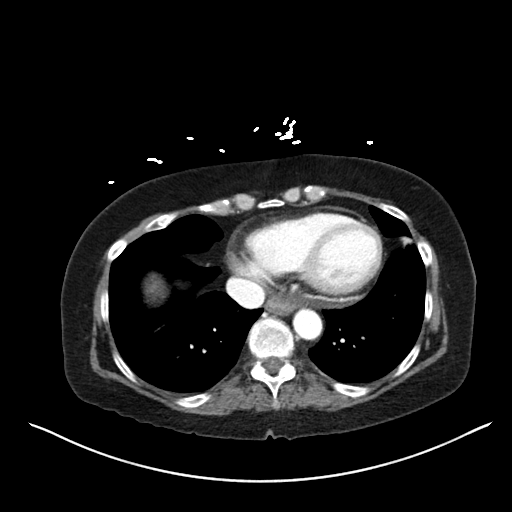

[Series 6: coronal soft tissue · coronal · 0.76mm/px · 3 of 98 slices shown]
[im 33/98  soft-tissue]
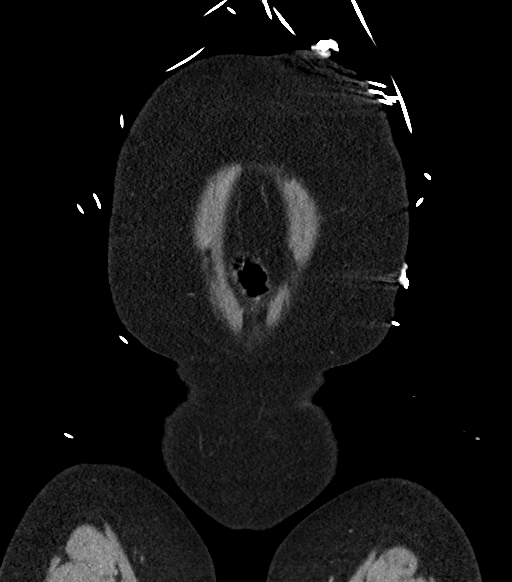
[im 44/98  soft-tissue]
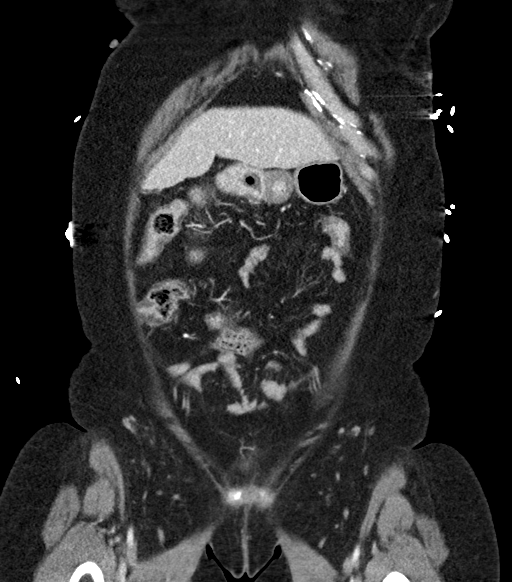
[im 54/98  soft-tissue]
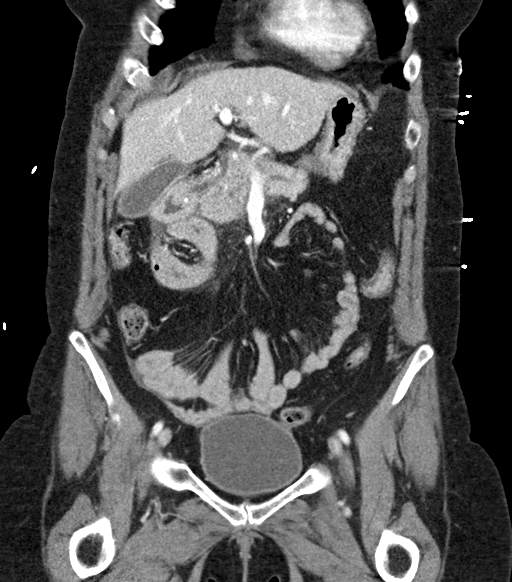

[16 of 46 positions shown; findings below may reference images not displayed]

FINDINGS: Lower chest: No acute abnormality.

Hepatobiliary: No focal liver abnormality is seen. No gallstones,
gallbladder wall thickening, or biliary dilatation.

Pancreas: Inflammatory changes within the pancreaticoduodenal
groove. Pancreatic parenchyma enhances without evidence of
pancreatic necrosis. Body and tail of the pancreas appear
unremarkable. Main pancreatic duct is upper limits of normal in
size.

Spleen: Normal in size without focal abnormality.

Adrenals/Urinary Tract: Adrenal glands are unremarkable. Kidneys are
normal, without renal calculi, focal lesion, or hydronephrosis.
Bladder is unremarkable.

Stomach/Bowel: Small hiatal hernia. The first, second, and proximal
third portions of the duodenum are circumferentially thickened.
There is surrounding fat stranding in a small amount of adjacent
free fluid. No abscess. No extraluminal air. No bowel obstruction.
Colon is largely decompressed. No focal colonic thickening or
pericolonic inflammatory changes.

Vascular/Lymphatic: Aortic atherosclerosis. Incidental note of a
retroaortic left renal vein, an anatomic variant. No enlarged
abdominal or pelvic lymph nodes.

Reproductive: Uterus and bilateral adnexa are unremarkable.

Other: Tiny fat containing umbilical hernia.

Musculoskeletal: No acute or significant osseous findings.
IMPRESSION: 1. Inflammatory changes adjacent to the duodenum and within the
pancreaticoduodenal groove with a small volume of upper abdominal
free fluid. Findings may represent acute duodenitis secondary to
infection, inflammation, versus ulcer. Groove pancreatitis is also a
consideration. Correlation with serum lipase is recommended.
2. Small hiatal hernia.
3. Aortic atherosclerosis.

Aortic Atherosclerosis ([id number]).

## 2020-06-09 IMAGING — US US ABDOMEN LIMITED
1 series · 14 of 25 positions shown · non-contrast
Comparison: [DATE] [DATE], [DATE].  [DATE] [DATE], [DATE].

CLINICAL DATA: Pancreatitis.

EXAM:
ULTRASOUND ABDOMEN LIMITED RIGHT UPPER QUADRANT

[Series 1: us abdomen limited · 14 of 59 slices shown]
[im 1/59]
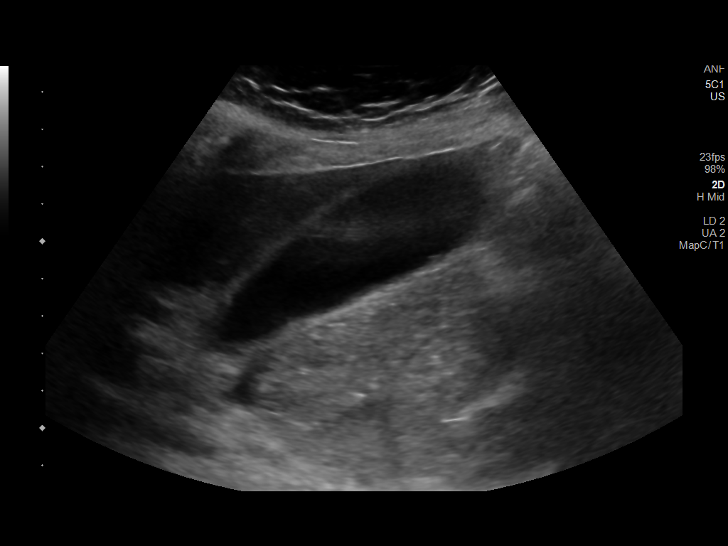
[im 5/59]
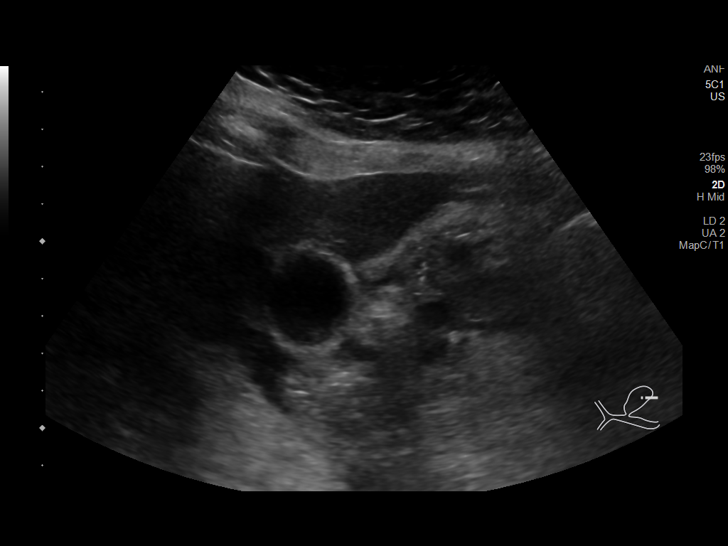
[im 10/59]
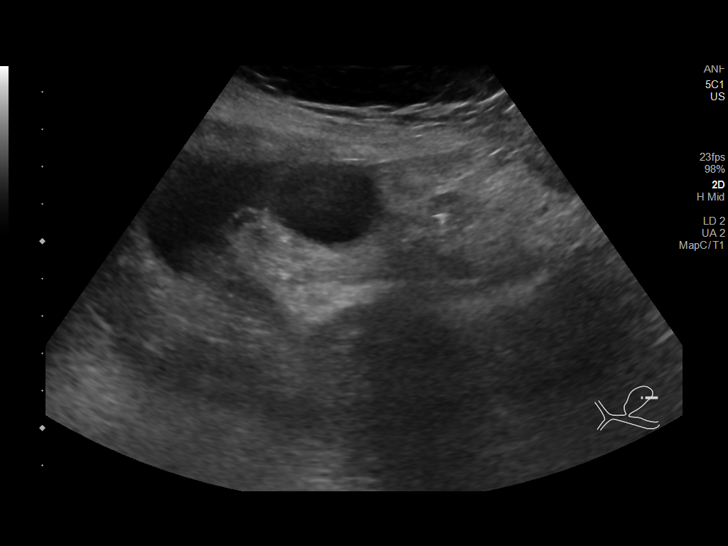
[im 15/59]
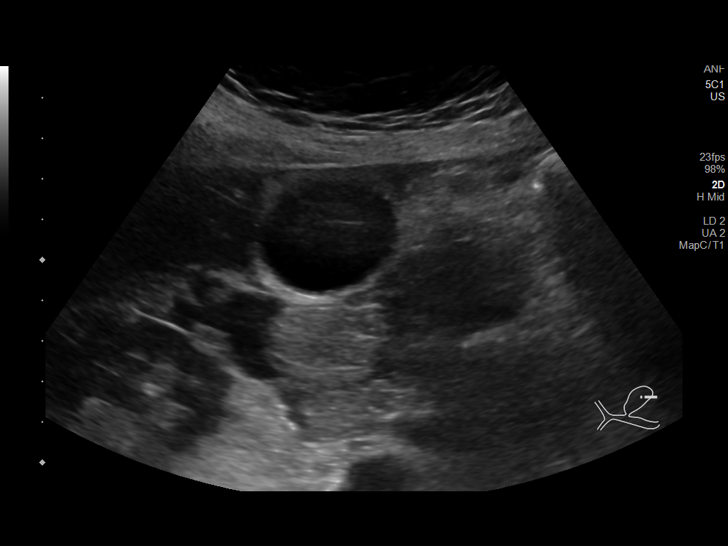
[im 20/59]
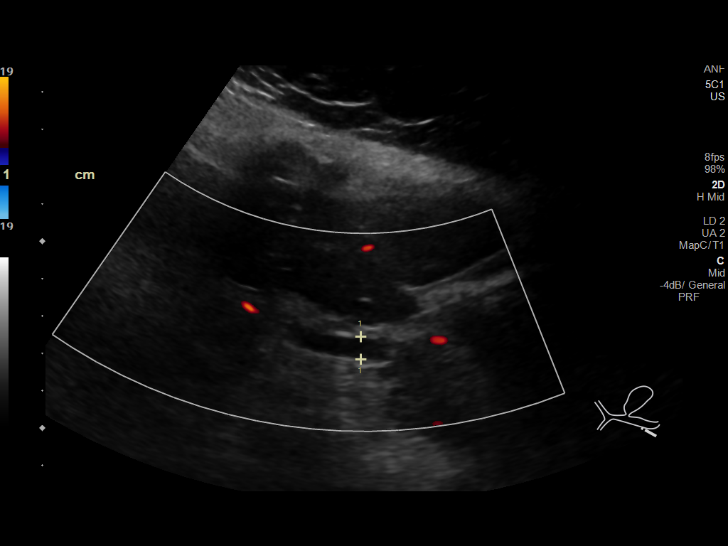
[im 22/59]
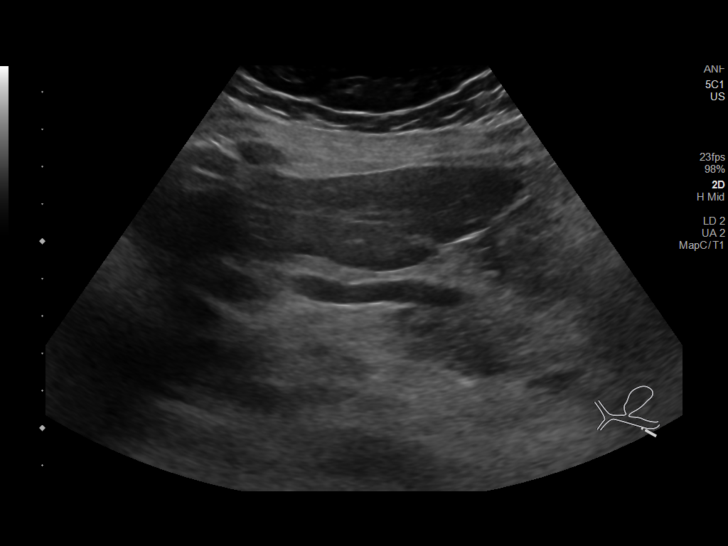
[im 27/59]
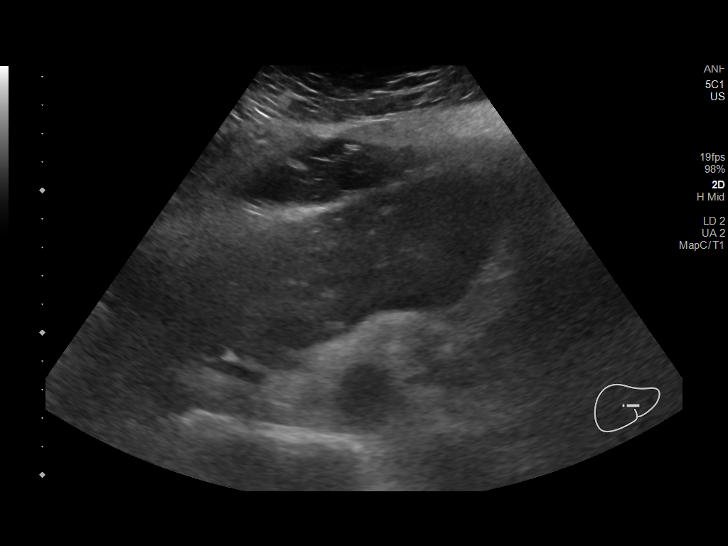
[im 32/59]
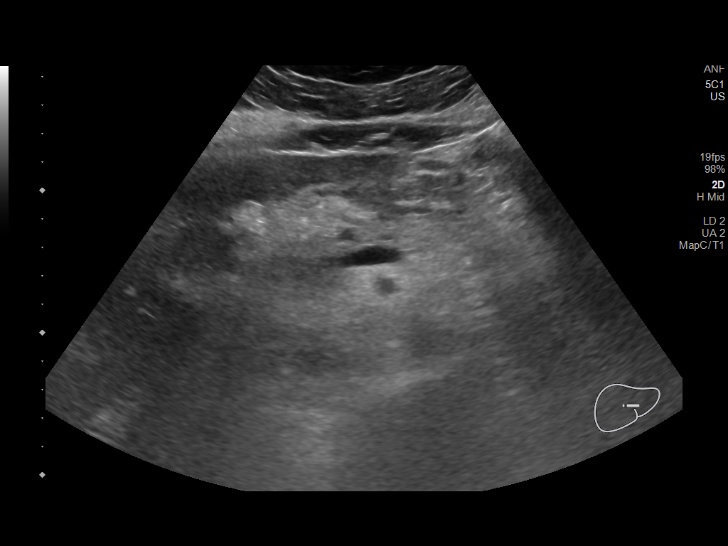
[im 37/59]
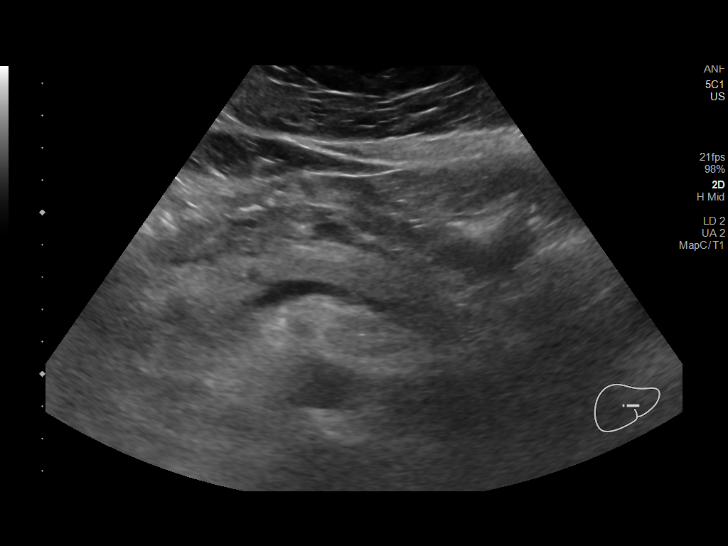
[im 39/59]
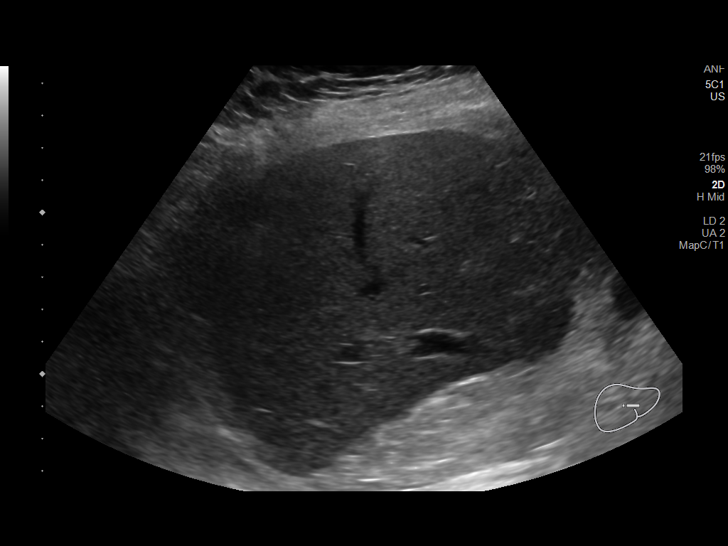
[im 44/59]
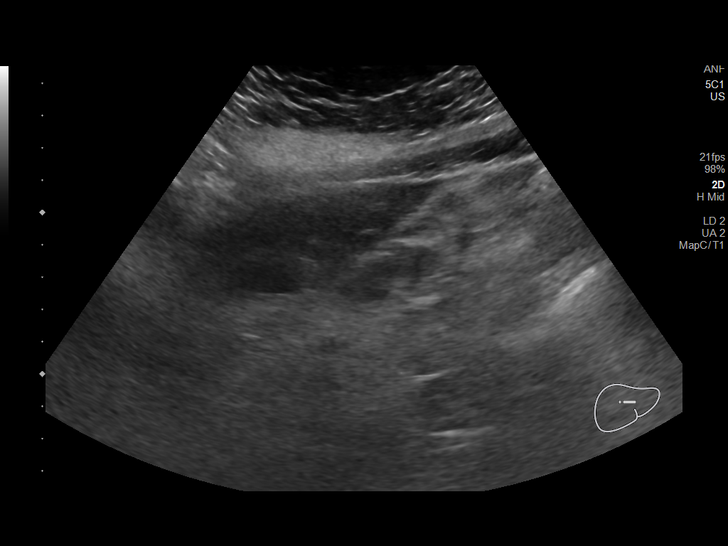
[im 49/59]
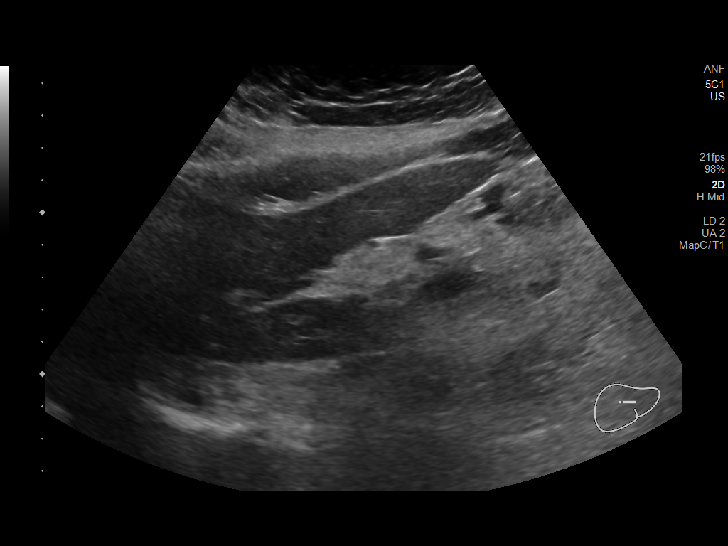
[im 54/59]
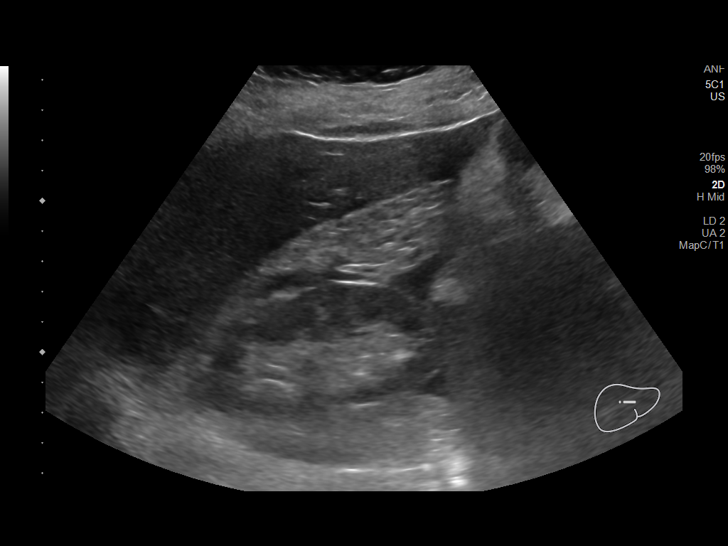
[im 59/59]
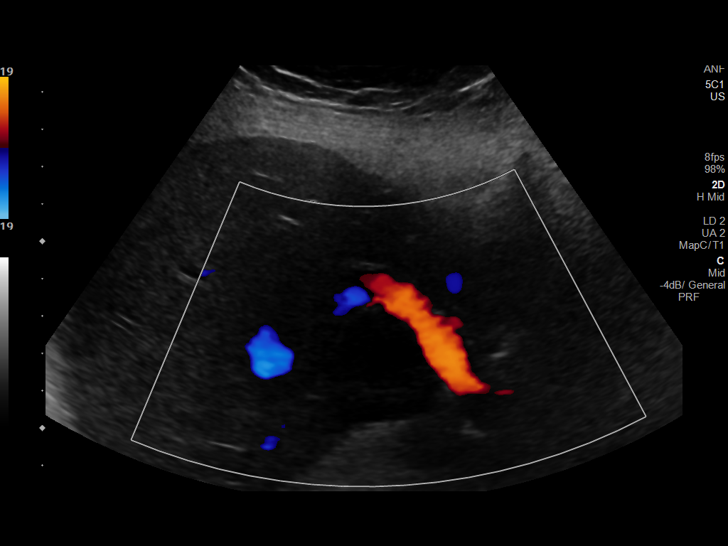

[14 of 25 positions shown; findings below may reference images not displayed]

FINDINGS: Gallbladder:

No gallstones or wall thickening visualized. No sonographic Murphy
sign noted by sonographer.

Common bile duct:

Diameter: 6 mm which is within normal limits.

Liver:

No focal lesion identified. Within normal limits in parenchymal
echogenicity. Portal vein is patent on color Doppler imaging with
normal direction of blood flow towards the liver.

Other: Minimal perihepatic fluid is noted. Pancreatic duct measures
3 mm which is at upper limits of normal.
IMPRESSION: Minimal perihepatic fluid is noted. No other definite abnormality
seen in the right upper quadrant of the abdomen.

## 2023-12-03 ENCOUNTER — Other Ambulatory Visit: Payer: Self-pay | Admitting: General Surgery

## 2023-12-03 DIAGNOSIS — K8020 Calculus of gallbladder without cholecystitis without obstruction: Secondary | ICD-10-CM

## 2023-12-03 DIAGNOSIS — R1032 Left lower quadrant pain: Secondary | ICD-10-CM

## 2023-12-09 ENCOUNTER — Ambulatory Visit
Admission: RE | Admit: 2023-12-09 | Discharge: 2023-12-09 | Disposition: A | Discharge status: Home / Self Care | Source: Ambulatory Visit | Attending: General Surgery | Admitting: General Surgery

## 2023-12-09 DIAGNOSIS — K8020 Calculus of gallbladder without cholecystitis without obstruction: Secondary | ICD-10-CM

## 2023-12-09 DIAGNOSIS — R1032 Left lower quadrant pain: Secondary | ICD-10-CM

## 2023-12-09 MED ORDER — IOPAMIDOL (ISOVUE-300) INJECTION 61%
100.0000 mL | Freq: Once | INTRAVENOUS | Status: AC | PRN
Start: 2023-12-09 — End: 2023-12-09
  Administered 2023-12-09: 100 mL via INTRAVENOUS

## 2023-12-17 ENCOUNTER — Encounter: Payer: Self-pay | Admitting: General Surgery

## 2023-12-17 ENCOUNTER — Other Ambulatory Visit: Payer: Self-pay | Admitting: General Surgery

## 2023-12-17 DIAGNOSIS — R1032 Left lower quadrant pain: Secondary | ICD-10-CM

## 2023-12-24 ENCOUNTER — Ambulatory Visit
Admission: RE | Admit: 2023-12-24 | Discharge: 2023-12-24 | Disposition: A | Discharge status: Home / Self Care | Source: Ambulatory Visit | Attending: General Surgery | Admitting: General Surgery

## 2023-12-24 DIAGNOSIS — R1032 Left lower quadrant pain: Secondary | ICD-10-CM

## 2023-12-24 MED ORDER — IOPAMIDOL (ISOVUE-300) INJECTION 61%
125.0000 mL | Freq: Once | INTRAVENOUS | Status: AC | PRN
Start: 2023-12-24 — End: 2023-12-24
  Administered 2023-12-24: 125 mL via INTRAVENOUS

## 2024-06-03 NOTE — Progress Notes (Signed)
" ° °  PROVIDER:  RICHERD SILVERSMITH, MD  MRN: [id number] DOB: [birth year] DATE OF ENCOUNTER: 06/03/2024 Interval History:     Briefly, Alyssa Gilbert is a 63 y.o. female who was last seen in clinic on 01/02/24, for evaluation of gallstones. 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  She was referred to GI, initially saw [facility] which she wanted a different provider and then [facility]. Referral was for evaluation for EGD given LUQ pain and to evaluate if any evidence of gastritis. EGD never performed. Per patient this was not addressed but GI provider recommended colonoscopy but patient did not want. She continues to have primarily LUQ/LLQpain, not RUQ pain. Once every few weeks to months will have a transient tinge of RUQ pain but as before, her LUQ/LLQ is the reason she is seeking care.   She was empirically treated by GI for diverticulitis per patient with flagyl which she did not tolerate due to severe N/V and abdominal pain.  Patient continuing to mostly drink liquids that minimizes the pain.     Objective  Physical Exam Gen: tearful due to stress from uncertainty of pain CV: RRR Pulm: NWOB Abd: Soft, nondistended, tenderness to deep palpation of left mid abdomen without evidence of hernia.  I reviewed notes from Dr. Ardyce clinic visits.   Assessment and Plan:     Alyssa Gilbert is a 63 y.o. 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 Given her age, it would be strange for the congenital malrotation to only be  starting to cause symptoms now especially since no volvulus or obstruction and no signs of small bowel or colonic wall thickening on extensive workup. I think in an effort to have complete workup before undergoing an operation that is not without risks and very well may not resolve her symptoms, she should have an EGD to rule out gastric etiology of symptoms. Additionally, recommended to patient although she doesn't want colonoscopy, she should have one at same time as this will help complete picture before pursuing an operation. - Will have our clinic reach out to Dr. Aniceto regarding request for EGD/colonoscopy and that patient is agreeable to both - Advised patient to call our clinic to schedule follow up appointment after date of above procedures has been schedule so that I can review EGD/Colonoscopy reports with patient before determining whether or not to move forward with surgery.  The plan was discussed in detail with the patient today, who expressed understanding.  The patient has our contact information, and understands to call the clinic with any additional questions or concerns in the interval.  I would be happy to see the patient back.  RICHERD SILVERSMITH, MD   "
